# Patient Record
Sex: Female | Born: 1985 | Race: Black or African American | Hispanic: No | Marital: Married | Smoking: Current every day smoker
Health system: Southern US, Community
[De-identification: ages and names within clinical notes are randomized; demographics above are authoritative.]

## PROBLEM LIST (undated history)

## (undated) ENCOUNTER — Inpatient Hospital Stay (HOSPITAL_COMMUNITY): Payer: Medicaid Other

## (undated) DIAGNOSIS — R87629 Unspecified abnormal cytological findings in specimens from vagina: Secondary | ICD-10-CM

## (undated) HISTORY — PX: DILATION AND CURETTAGE OF UTERUS: SHX78

## (undated) HISTORY — PX: OVARIAN CYST REMOVAL: SHX89

---
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
## 2008-03-27 ENCOUNTER — Emergency Department: Payer: Self-pay | Admitting: Emergency Medicine

## 2010-01-25 ENCOUNTER — Emergency Department: Payer: Self-pay | Admitting: Emergency Medicine

## 2010-03-10 ENCOUNTER — Observation Stay: Payer: Self-pay | Admitting: Obstetrics and Gynecology

## 2010-04-27 ENCOUNTER — Encounter: Payer: Self-pay | Admitting: Obstetrics and Gynecology

## 2010-06-09 ENCOUNTER — Encounter: Payer: Self-pay | Admitting: Pediatric Cardiology

## 2010-07-01 ENCOUNTER — Emergency Department: Payer: Self-pay | Admitting: Emergency Medicine

## 2010-07-29 ENCOUNTER — Observation Stay: Payer: Self-pay

## 2013-02-21 ENCOUNTER — Encounter (HOSPITAL_COMMUNITY): Payer: Self-pay | Admitting: Emergency Medicine

## 2013-02-21 ENCOUNTER — Emergency Department (HOSPITAL_COMMUNITY)
Admission: EM | Admit: 2013-02-21 | Discharge: 2013-02-21 | Disposition: A | Discharge status: Home / Self Care | Payer: Self-pay | Attending: Emergency Medicine | Admitting: Emergency Medicine

## 2013-02-21 DIAGNOSIS — G5600 Carpal tunnel syndrome, unspecified upper limb: Secondary | ICD-10-CM | POA: Insufficient documentation

## 2013-02-21 DIAGNOSIS — F17200 Nicotine dependence, unspecified, uncomplicated: Secondary | ICD-10-CM | POA: Insufficient documentation

## 2013-02-21 DIAGNOSIS — G5602 Carpal tunnel syndrome, left upper limb: Secondary | ICD-10-CM

## 2013-02-21 DIAGNOSIS — R209 Unspecified disturbances of skin sensation: Secondary | ICD-10-CM | POA: Insufficient documentation

## 2013-02-21 MED ORDER — NAPROXEN 500 MG PO TABS: 500.0000 mg | ORAL_TABLET | Freq: Two times a day (BID) | ORAL | Status: DC

## 2013-02-21 NOTE — ED Notes (Signed)
Pt states that she has pain in her right wrist and numbness in her left hand that has been going on for about three months.  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

## 2013-02-21 NOTE — ED Provider Notes (Signed)
Medical screening examination/treatment/procedure(s) were performed by non-physician practitioner and as supervising physician I was immediately available for consultation/collaboration.    Gilda Crease, MD 02/21/13 [phone]
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
## 2013-02-21 NOTE — ED Provider Notes (Signed)
History    This chart was scribed for non-physician practitioner Magnus Sinning, PA-C, working with Gilda Crease,  by Donne Anon, ED Scribe. This patient was seen in room WTR7/WTR7 and the patient's care was started at 1504.  CSN: [id number] Arrival date & time 02/21/13  1354  First MD Initiated Contact with Patient 02/21/13 1504     Chief Complaint  Patient presents with  . Wrist Pain    right hand  . Numbness    let hand    The history is provided by the patient. No language interpreter was used.   HPI Comments: Cathy [name] is a 27 y.o. female who presents to the Emergency Department complaining of 3 months of gradual onset, gradually worsening, waxing and waning, moderate bilateral hand and wrist pain that radiates up her forearm and is worse in the left hand. She reports associated subjective numbness in the first 4 digits of the left hand and the second digit of the right hand. The pain is worse at night and in the morning when she wakes up. 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    History reviewed. No pertinent past medical history. Past Surgical History  Procedure Laterality Date  . Cesarean section     No family history on file. History  Substance Use Topics  . Smoking status: Current Every Day Smoker    Types: Cigarettes  . Smokeless tobacco: Not on file  . Alcohol Use: No   OB History   Grav Para Term Preterm Abortions TAB SAB Ect Mult Living                 Review of Systems  Musculoskeletal: Positive for arthralgias.  Neurological: Positive for numbness.  All other systems reviewed and are negative.    Allergies  Review of patient's allergies indicates not on file.  Home Medications  No current outpatient prescriptions on file.  Triage Vitals; BP 120/60  Pulse 68  Temp(Src) 99.4 F (37.4 C) (Oral)  Resp 16   SpO2 100%  LMP 02/06/2013  Physical Exam  Nursing note and vitals reviewed. Constitutional: She appears well-developed and well-nourished. No distress.  HENT:  Head: Normocephalic and atraumatic.  Eyes: Conjunctivae are normal.  Neck: Neck supple. No tracheal deviation present.  Cardiovascular: Normal rate, regular rhythm, normal heart sounds and intact distal pulses.  Exam reveals no gallop and no friction rub.   No murmur heard. Pulses:      Radial pulses are 2+ on the right side, and 2+ on the left side.  Pulmonary/Chest: Effort normal and breath sounds normal. No respiratory distress. She has no wheezes. She has no rales.  Musculoskeletal: Normal range of motion.       Right wrist: She exhibits normal range of motion, no tenderness, no bony tenderness and no swelling.       Left wrist: She exhibits normal range of motion, no tenderness, no bony tenderness and no swelling.  Distal sensation of both hands intact. Positive Phalen's sign bilaterally. Negative Tinel's sign bilaterally.    Neurological: She is alert.  Grip strength 5/5 bilaterally  Skin: Skin is warm and dry.  Psychiatric: She has a normal mood and affect. Her behavior is normal.    ED Course  Procedures (including critical care time) DIAGNOSTIC STUDIES: Oxygen Saturation is 100% on RA, normal by my interpretation.  COORDINATION OF CARE: 3:16 PM Discussed treatment plan which includes Naproxen and wrist splint with pt at bedside and pt agreed to plan. Discussed carpal tunnel. Orthopedic referral given.    Labs Reviewed - No data to display No results found. No diagnosis found.  MDM  Signs and symptoms most consistent with Carpal Tunnel.  Patient given wrist splint and instructed to take NSAIDs.  Patient also instructed to follow up with PCP if symptoms continue and also given referral to Hand Surgery.  I personally performed the services described in this documentation, which was scribed in my presence. The  recorded information has been reviewed and is accurate.    Pascal Lux [place], PA-C 02/21/13 1539

## 2013-12-09 ENCOUNTER — Emergency Department: Payer: Self-pay | Admitting: Emergency Medicine

## 2013-12-17 ENCOUNTER — Emergency Department: Payer: Self-pay | Admitting: Emergency Medicine

## 2013-12-17 LAB — COMPREHENSIVE METABOLIC PANEL
AST: 11 U/L — AB (ref 15–37)
Albumin: 3.5 g/dL (ref 3.4–5.0)
Alkaline Phosphatase: 38 U/L — ABNORMAL LOW
Anion Gap: 4 — ABNORMAL LOW (ref 7–16)
BUN: 8 mg/dL (ref 7–18)
Bilirubin,Total: 0.3 mg/dL (ref 0.2–1.0)
CALCIUM: 8.7 mg/dL (ref 8.5–10.1)
CO2: 27 mmol/L (ref 21–32)
Chloride: 104 mmol/L (ref 98–107)
Creatinine: 0.58 mg/dL — ABNORMAL LOW (ref 0.60–1.30)
EGFR (Non-African Amer.): >60
GLUCOSE: 90 mg/dL (ref 65–99)
Osmolality: 268 (ref 275–301)
POTASSIUM: 3.8 mmol/L (ref 3.5–5.1)
SGPT (ALT): 18 U/L (ref 12–78)
Sodium: 135 mmol/L — ABNORMAL LOW (ref 136–145)
Total Protein: 7.3 g/dL (ref 6.4–8.2)

## 2013-12-17 LAB — CBC WITH DIFFERENTIAL/PLATELET
BASOS ABS: 0 10*3/uL (ref 0.0–0.1)
Basophil %: 0.5 %
EOS PCT: 1.6 %
Eosinophil #: 0.1 10*3/uL (ref 0.0–0.7)
HCT: 36.4 % (ref 35.0–47.0)
HGB: 12.1 g/dL (ref 12.0–16.0)
LYMPHS ABS: 2.7 10*3/uL (ref 1.0–3.6)
Lymphocyte %: 29.1 %
MCH: 31.4 pg (ref 26.0–34.0)
MCHC: 33.3 g/dL (ref 32.0–36.0)
MCV: 94 fL (ref 80–100)
Monocyte #: 0.8 x10 3/mm (ref 0.2–0.9)
Monocyte %: 8.6 %
NEUTROS PCT: 60.2 %
Neutrophil #: 5.6 10*3/uL (ref 1.4–6.5)
Platelet: 288 10*3/uL (ref 150–440)
RBC: 3.85 10*6/uL (ref 3.80–5.20)
RDW: 12.8 % (ref 11.5–14.5)
WBC: 9.3 10*3/uL (ref 3.6–11.0)

## 2013-12-17 LAB — URINALYSIS, COMPLETE
BILIRUBIN, UR: NEGATIVE
BLOOD: NEGATIVE
Glucose,UR: NEGATIVE mg/dL (ref 0–75)
Ketone: NEGATIVE
Leukocyte Esterase: NEGATIVE
NITRITE: NEGATIVE
PH: 7 (ref 4.5–8.0)
Protein: NEGATIVE
RBC,UR: NONE SEEN /HPF (ref 0–5)
SPECIFIC GRAVITY: 1.023 (ref 1.003–1.030)
Squamous Epithelial: <1
WBC UR: NONE SEEN /HPF (ref 0–5)

## 2013-12-17 LAB — TSH: Thyroid Stimulating Horm: 1.65 u[IU]/mL

## 2013-12-17 LAB — HCG, QUANTITATIVE, PREGNANCY: Beta Hcg, Quant.: 66067 m[IU]/mL — ABNORMAL HIGH

## 2014-03-04 ENCOUNTER — Emergency Department: Payer: Self-pay | Admitting: Emergency Medicine

## 2014-03-19 ENCOUNTER — Observation Stay: Payer: Self-pay | Admitting: Obstetrics and Gynecology

## 2014-03-19 ENCOUNTER — Emergency Department: Payer: Self-pay | Admitting: Emergency Medicine

## 2014-04-25 ENCOUNTER — Encounter: Payer: Self-pay | Admitting: Maternal & Fetal Medicine

## 2014-05-02 ENCOUNTER — Encounter: Payer: Self-pay | Admitting: Maternal & Fetal Medicine

## 2014-05-24 ENCOUNTER — Observation Stay: Payer: Self-pay | Admitting: Obstetrics and Gynecology

## 2014-06-24 ENCOUNTER — Encounter (HOSPITAL_COMMUNITY): Payer: Self-pay | Admitting: *Deleted

## 2014-06-24 ENCOUNTER — Inpatient Hospital Stay (HOSPITAL_COMMUNITY)
Admission: AD | Admit: 2014-06-24 | Discharge: 2014-06-24 | Disposition: A | Discharge status: Home / Self Care | Payer: Medicaid Other | Source: Ambulatory Visit | Attending: Family Medicine | Admitting: Family Medicine

## 2014-06-24 DIAGNOSIS — N949 Unspecified condition associated with female genital organs and menstrual cycle: Secondary | ICD-10-CM

## 2014-06-24 DIAGNOSIS — Z720 Tobacco use: Secondary | ICD-10-CM | POA: Diagnosis not present

## 2014-06-24 DIAGNOSIS — Z3A34 34 weeks gestation of pregnancy: Secondary | ICD-10-CM | POA: Diagnosis not present

## 2014-06-24 DIAGNOSIS — R102 Pelvic and perineal pain: Secondary | ICD-10-CM | POA: Diagnosis not present

## 2014-06-24 DIAGNOSIS — O26893 Other specified pregnancy related conditions, third trimester: Secondary | ICD-10-CM | POA: Diagnosis not present

## 2014-06-24 HISTORY — DX: Unspecified abnormal cytological findings in specimens from vagina: R87.629

## 2014-06-24 LAB — URINALYSIS, ROUTINE W REFLEX MICROSCOPIC
Bilirubin Urine: NEGATIVE
Glucose, UA: NEGATIVE mg/dL
Hgb urine dipstick: NEGATIVE
Ketones, ur: NEGATIVE mg/dL
Leukocytes, UA: NEGATIVE
Nitrite: NEGATIVE
Protein, ur: NEGATIVE mg/dL
Specific Gravity, Urine: >1.03 — ABNORMAL HIGH (ref 1.005–1.030)
Urobilinogen, UA: 0.2 mg/dL (ref 0.0–1.0)
pH: 7 (ref 5.0–8.0)

## 2014-06-24 LAB — WET PREP, GENITAL
Trich, Wet Prep: NONE SEEN
WBC, Wet Prep HPF POC: NONE SEEN
Yeast Wet Prep HPF POC: NONE SEEN

## 2014-06-24 LAB — POCT FERN TEST: POCT Fern Test: NEGATIVE

## 2014-06-24 MED ORDER — METRONIDAZOLE 500 MG PO TABS: 500.0000 mg | ORAL_TABLET | Freq: Two times a day (BID) | ORAL | Status: DC

## 2014-06-24 NOTE — Discharge Instructions (Signed)

## 2014-06-24 NOTE — MAU Provider Note (Signed)
Chief Complaint:  Pelvic Pressure   None     HPI: Cathy [name] is a 28 y.o.  [id number] @ 7857w3d w/ PMH only contributory for c-section x 4. [place] [facility] [place] [facility], but [place]. [facility].  Notes for the past 3-4 weeks she has noted some worsening pelvic pressure and pain. She also notes some worsening leakage of fluid over the past several weeks (~ 3 wks ago) that her boyfriend noticed that he was afraid she "wet her underwear with." Since, she has had to wear a thin panty liner for fluid. Denies any large gush of fluid.  Denies contractions or vaginal bleeding. Good fetal movement.   Pregnancy Course:  Reportedly uncomplicated [facility]   Past Medical History: Past Medical History  Diagnosis Date  . Vaginal Pap smear, abnormal     Past obstetric history: OB History  Gravida Para Term Preterm AB SAB TAB Ectopic Multiple Living  5 4 2 2     1 5     # Outcome Date GA Lbr Len/2nd Weight Sex Delivery Anes PTL Lv  5 Current           4 Preterm           3 Preterm           2 Term           1 Term               Past Surgical History: Past Surgical History  Procedure Laterality Date  . Cesarean section    . Ovarian cyst removal    . Dilation and curettage of uterus       Family History: History reviewed. No pertinent family history.  Social History: History  Substance Use Topics  . Smoking status: Current Every Day Smoker -- 0.25 packs/day    Types: Cigarettes  . Smokeless tobacco: Not on file  . Alcohol Use: No    Allergies:  Allergies  Allergen Reactions  . Penicillins Other (See Comments)    Unknown childhood reaction.  . Latex Itching, Swelling and Rash    Meds:  No prescriptions prior to admission    ROS: Pertinent findings in history of present illness.  Physical Exam  Blood pressure 115/62, pulse 77, temperature 98.4 F (36.9 C), temperature  source Oral, resp. rate 18, height 5\' 1"  (1.549 m), weight 224 lb (101.606 kg). GENERAL: Well-developed, well-nourished female in no acute distress.  HEENT: normocephalic HEART: normal rate RESP: normal effort ABDOMEN: Soft, non-tender, gravid appropriate for gestational age EXTREMITIES: Nontender, no edema NEURO: alert and oriented SPECULUM EXAM: NEFG, copious white discharge, no blood, cervix clean Dilation: Closed Effacement (%): Thick Cervical Position: Posterior Exam by:: Dr. Su Hiltoberts  FHT:  Baseline 130 , moderate variability, accelerations present, no decelerations Contractions: quiet   Labs: Results for orders placed or performed during the hospital encounter of 06/24/14 (from the past 24 hour(s))  Urinalysis, Routine w reflex microscopic     Status: Abnormal   Collection Time: 06/24/14  3:55 PM  Result Value Ref Range   Color, Urine YELLOW YELLOW   APPearance CLEAR CLEAR   Specific Gravity, Urine >1.030 (H) 1.005 - 1.030   pH 7.0 5.0 - 8.0   Glucose, UA NEGATIVE NEGATIVE mg/dL   Hgb urine dipstick NEGATIVE NEGATIVE   Bilirubin Urine NEGATIVE NEGATIVE   Ketones, ur NEGATIVE NEGATIVE mg/dL   Protein,  ur NEGATIVE NEGATIVE mg/dL   Urobilinogen, UA 0.2 0.0 - 1.0 mg/dL   Nitrite NEGATIVE NEGATIVE   Leukocytes, UA NEGATIVE NEGATIVE  Wet prep, genital     Status: Abnormal   Collection Time: 06/24/14  5:10 PM  Result Value Ref Range   Yeast Wet Prep HPF POC NONE SEEN NONE SEEN   Trich, Wet Prep NONE SEEN NONE SEEN   Clue Cells Wet Prep HPF POC MODERATE (A) NONE SEEN   WBC, Wet Prep HPF POC NONE SEEN NONE SEEN  Fern Test     Status: None   Collection Time: 06/24/14  5:40 PM  Result Value Ref Range   POCT Fern Test Negative = intact amniotic membranes     Imaging:  No results found.  MAU Course: SVE with cervix c/t/h SSE with cervix visually closed. No pooling. No ferning on slide Wet prep with mod clue cells  Assessment: 1. Pelvic pressure in pregnancy,  antepartum, third trimester     Plan: Discharge home Flagyl 500 mg BID x 7d, likely BV source of increased discharged Suspecy pelvic pain 2/2 normal pregnancy pains vs streching as pregnancy progresses. Not in active labor. [facility] Pre-term Labor precautions and fetal kick counts     Follow-up Information    [facility]. Schedule an appointment as soon as possible for a visit in 1 week.   Contact information:   [address] [place] [phone]       Medication List    STOP taking these medications        naproxen 500 MG tablet  Commonly known as:  NAPROSYN      TAKE these medications        calcium carbonate 500 MG chewable tablet  Commonly known as:  TUMS - dosed in mg elemental calcium  Chew 1 tablet by mouth 2 (two) times daily as needed for indigestion or heartburn.     HAIR SKIN & NAILS GUMMIES PO  Take 2 each by mouth daily.     metroNIDAZOLE 500 MG tablet  Commonly known as:  FLAGYL  Take 1 tablet (500 mg total) by mouth 2 (two) times daily.        Ethelda Chickaroline Nayquan Evinger, MD 06/25/2014 3:35 AM

## 2014-06-24 NOTE — MAU Note (Signed)
Pt has had a hx of 4 c/s and states she feels like pressure in her vagina and also old incisional pain for several days.  Denies vaginal bleeding.  Pt states she is having some leaking of fluids daily and requiring to wear a thin pad.   Good fetal movement.  Pt is not a very good historian and has difficulty explaining what she is feeling.

## 2014-06-26 ENCOUNTER — Ambulatory Visit (INDEPENDENT_AMBULATORY_CARE_PROVIDER_SITE_OTHER): Payer: Medicaid Other | Admitting: Family Medicine

## 2014-06-26 ENCOUNTER — Encounter: Payer: Self-pay | Admitting: Family Medicine

## 2014-06-26 VITALS — BP 106/53 | HR 93 | Temp 98.4°F | Wt 234.0 lb

## 2014-06-26 DIAGNOSIS — Z3483 Encounter for supervision of other normal pregnancy, third trimester: Secondary | ICD-10-CM

## 2014-06-26 DIAGNOSIS — O3421 Maternal care for scar from previous cesarean delivery: Secondary | ICD-10-CM

## 2014-06-26 DIAGNOSIS — Z8279 Family history of other congenital malformations, deformations and chromosomal abnormalities: Secondary | ICD-10-CM

## 2014-06-26 DIAGNOSIS — O34219 Maternal care for unspecified type scar from previous cesarean delivery: Secondary | ICD-10-CM

## 2014-06-26 DIAGNOSIS — Z3480 Encounter for supervision of other normal pregnancy, unspecified trimester: Secondary | ICD-10-CM | POA: Insufficient documentation

## 2014-06-26 LAB — POCT URINALYSIS DIP (DEVICE)
Bilirubin Urine: NEGATIVE
Glucose, UA: NEGATIVE mg/dL
Hgb urine dipstick: NEGATIVE
Ketones, ur: NEGATIVE mg/dL
Leukocytes, UA: NEGATIVE
Nitrite: NEGATIVE
Protein, ur: NEGATIVE mg/dL
Specific Gravity, Urine: 1.02 (ref 1.005–1.030)
Urobilinogen, UA: 1 mg/dL (ref 0.0–1.0)
pH: 7 (ref 5.0–8.0)

## 2014-06-26 NOTE — Patient Instructions (Signed)
Third Trimester of Pregnancy The third trimester is from week 29 through week 42, months 7 through 9. The third trimester is a time when the fetus is growing rapidly. At the end of the ninth month, the fetus is about 20 inches in length and weighs 6-10 pounds.  BODY CHANGES Your body goes through many changes during pregnancy. The changes vary from woman to woman.   Your weight will continue to increase. You can expect to gain 25-35 pounds (11-16 kg) by the end of the pregnancy.  You may begin to get stretch marks on your hips, abdomen, and breasts.  You may urinate more often because the fetus is moving lower into your pelvis and pressing on your bladder.  You may develop or continue to have heartburn as a result of your pregnancy.  You may develop constipation because certain hormones are causing the muscles that push waste through your intestines to slow down.  You may develop hemorrhoids or swollen, bulging veins (varicose veins).  You may have pelvic pain because of the weight gain and pregnancy hormones relaxing your joints between the bones in your pelvis. Backaches may result from overexertion of the muscles supporting your posture.  You may have changes in your hair. These can include thickening of your hair, rapid growth, and changes in texture. Some women also have hair loss during or after pregnancy, or hair that feels dry or thin. Your hair will most likely return to normal after your baby is born.  Your breasts will continue to grow and be tender. A yellow discharge may leak from your breasts called colostrum.  Your belly button may stick out.  You may feel short of breath because of your expanding uterus.  You may notice the fetus "dropping," or moving lower in your abdomen.  You may have a bloody mucus discharge. This usually occurs a few days to a week before labor begins.  Your cervix becomes thin and soft (effaced) near your due date. WHAT TO EXPECT AT YOUR PRENATAL  EXAMS  You will have prenatal exams every 2 weeks until week 36. Then, you will have weekly prenatal exams. During a routine prenatal visit:  You will be weighed to make sure you and the fetus are growing normally.  Your blood pressure is taken.  Your abdomen will be measured to track your baby's growth.  The fetal heartbeat will be listened to.  Any test results from the previous visit will be discussed.  You may have a cervical check near your due date to see if you have effaced. At around 36 weeks, your caregiver will check your cervix. At the same time, your caregiver will also perform a test on the secretions of the vaginal tissue. This test is to determine if a type of bacteria, Group B streptococcus, is present. Your caregiver will explain this further. Your caregiver may ask you:  What your birth plan is.  How you are feeling.  If you are feeling the baby move.  If you have had any abnormal symptoms, such as leaking fluid, bleeding, severe headaches, or abdominal cramping.  If you have any questions. Other tests or screenings that may be performed during your third trimester include:  Blood tests that check for low iron levels (anemia).  Fetal testing to check the health, activity level, and growth of the fetus. Testing is done if you have certain medical conditions or if there are problems during the pregnancy. FALSE LABOR You may feel small, irregular contractions that   eventually go away. These are called Braxton Hicks contractions, or false labor. Contractions may last for hours, days, or even weeks before true labor sets in. If contractions come at regular intervals, intensify, or become painful, it is best to be seen by your caregiver.  SIGNS OF LABOR   Menstrual-like cramps.  Contractions that are 5 minutes apart or less.  Contractions that start on the top of the uterus and spread down to the lower abdomen and back.  A sense of increased pelvic pressure or back  pain.  A watery or bloody mucus discharge that comes from the vagina. If you have any of these signs before the 37th week of pregnancy, call your caregiver right away. You need to go to the hospital to get checked immediately. HOME CARE INSTRUCTIONS   Avoid all smoking, herbs, alcohol, and unprescribed drugs. These chemicals affect the formation and growth of the baby.  Follow your caregiver's instructions regarding medicine use. There are medicines that are either safe or unsafe to take during pregnancy.  Exercise only as directed by your caregiver. Experiencing uterine cramps is a good sign to stop exercising.  Continue to eat regular, healthy meals.  Wear a good support bra for breast tenderness.  Do not use hot tubs, steam rooms, or saunas.  Wear your seat belt at all times when driving.  Avoid raw meat, uncooked cheese, cat litter boxes, and soil used by cats. These carry germs that can cause birth defects in the baby.  Take your prenatal vitamins.  Try taking a stool softener (if your caregiver approves) if you develop constipation. Eat more high-fiber foods, such as fresh vegetables or fruit and whole grains. Drink plenty of fluids to keep your urine clear or pale yellow.  Take warm sitz baths to soothe any pain or discomfort caused by hemorrhoids. Use hemorrhoid cream if your caregiver approves.  If you develop varicose veins, wear support hose. Elevate your feet for 15 minutes, 3-4 times a day. Limit salt in your diet.  Avoid heavy lifting, wear low heal shoes, and practice good posture.  Rest a lot with your legs elevated if you have leg cramps or low back pain.  Visit your dentist if you have not gone during your pregnancy. Use a soft toothbrush to brush your teeth and be gentle when you floss.  A sexual relationship may be continued unless your caregiver directs you otherwise.  Do not travel far distances unless it is absolutely necessary and only with the approval  of your caregiver.  Take prenatal classes to understand, practice, and ask questions about the labor and delivery.  Make a trial run to the hospital.  Pack your hospital bag.  Prepare the baby's nursery.  Continue to go to all your prenatal visits as directed by your caregiver. SEEK MEDICAL CARE IF:  You are unsure if you are in labor or if your water has broken.  You have dizziness.  You have mild pelvic cramps, pelvic pressure, or nagging pain in your abdominal area.  You have persistent nausea, vomiting, or diarrhea.  You have a bad smelling vaginal discharge.  You have pain with urination. SEEK IMMEDIATE MEDICAL CARE IF:   You have a fever.  You are leaking fluid from your vagina.  You have spotting or bleeding from your vagina.  You have severe abdominal cramping or pain.  You have rapid weight loss or gain.  You have shortness of breath with chest pain.  You notice sudden or extreme swelling   of your face, hands, ankles, feet, or legs.  You have not felt your baby move in over an hour.  You have severe headaches that do not go away with medicine.  You have vision changes. Document Released: 07/13/2001 Document Revised: 07/24/2013 Document Reviewed: 09/19/2012 ExitCare Patient Information 2015 [employer]. This information is not intended to replace advice given to you by your health care provider. Make sure you discuss any questions you have with your health care provider.  

## 2014-06-26 NOTE — Progress Notes (Signed)
Has prior baby with down syndrome - gave baby up for adoption.  Did have [facility] at [facility].  No complaints today - some pelvic pressure.  Will get 28 week labs.  [place].  Will get ROI.  Schedule for repeat cesarean at 39 weeks.

## 2014-06-26 NOTE — Progress Notes (Signed)
Patient reports being seen [facility] at [facility] in [place]-- started care after 20 weeks. Reports she was also being seen at [facility] for testing as her first child had downs syndrome-- according to patient everything with this pregnancy looks normal. Reports having "all kinds of blood work" done [facility] but states they never did her 28 week labs or gestational diabetes screening.  C/o of pelvic pressure.  28 week labs and 1hr gtt today.  Patient to sign ROI to request prenatal [facility].

## 2014-06-27 LAB — CBC
HCT: 32.3 % — ABNORMAL LOW (ref 36.0–46.0)
Hemoglobin: 10.7 g/dL — ABNORMAL LOW (ref 12.0–15.0)
MCH: 29.9 pg (ref 26.0–34.0)
MCHC: 33.1 g/dL (ref 30.0–36.0)
MCV: 90.2 fL (ref 78.0–100.0)
MPV: 9.8 fL (ref 9.4–12.4)
Platelets: 247 10*3/uL (ref 150–400)
RBC: 3.58 MIL/uL — ABNORMAL LOW (ref 3.87–5.11)
RDW: 13.7 % (ref 11.5–15.5)
WBC: 10.9 10*3/uL — ABNORMAL HIGH (ref 4.0–10.5)

## 2014-06-27 LAB — RPR

## 2014-06-27 LAB — HIV ANTIBODY (ROUTINE TESTING W REFLEX): HIV 1&2 Ab, 4th Generation: NONREACTIVE

## 2014-06-27 LAB — GLUCOSE TOLERANCE, 1 HOUR (50G) W/O FASTING: Glucose, 1 Hour GTT: 92 mg/dL (ref 70–140)

## 2014-07-01 ENCOUNTER — Encounter: Payer: Self-pay | Admitting: *Deleted

## 2014-07-02 LAB — GC/CHLAMYDIA PROBE AMP
CT Probe RNA: NEGATIVE
GC Probe RNA: NEGATIVE

## 2014-07-03 ENCOUNTER — Encounter: Payer: Self-pay | Admitting: *Deleted

## 2014-07-08 ENCOUNTER — Encounter: Payer: Medicaid Other | Admitting: Obstetrics & Gynecology

## 2014-07-10 ENCOUNTER — Encounter: Payer: Self-pay | Admitting: Advanced Practice Midwife

## 2014-07-10 ENCOUNTER — Ambulatory Visit (INDEPENDENT_AMBULATORY_CARE_PROVIDER_SITE_OTHER): Payer: Medicaid Other | Admitting: Advanced Practice Midwife

## 2014-07-10 VITALS — BP 99/63 | HR 90 | Temp 97.8°F | Wt 237.8 lb

## 2014-07-10 DIAGNOSIS — Z98891 History of uterine scar from previous surgery: Secondary | ICD-10-CM

## 2014-07-10 DIAGNOSIS — O3421 Maternal care for scar from previous cesarean delivery: Secondary | ICD-10-CM

## 2014-07-10 LAB — POCT URINALYSIS DIP (DEVICE)
Bilirubin Urine: NEGATIVE
Glucose, UA: NEGATIVE mg/dL
Hgb urine dipstick: NEGATIVE
Ketones, ur: NEGATIVE mg/dL
Leukocytes, UA: NEGATIVE
Nitrite: NEGATIVE
PH: 6.5 (ref 5.0–8.0)
Protein, ur: NEGATIVE mg/dL
Specific Gravity, Urine: 1.02 (ref 1.005–1.030)
UROBILINOGEN UA: 1 mg/dL (ref 0.0–1.0)

## 2014-07-10 LAB — OB RESULTS CONSOLE GC/CHLAMYDIA
Chlamydia: NEGATIVE
Gonorrhea: NEGATIVE

## 2014-07-10 NOTE — Progress Notes (Signed)
C Section scheduled for July 30, 2014.

## 2014-07-10 NOTE — Patient Instructions (Addendum)
Third Trimester of Pregnancy The third trimester is from week 29 through week 42, months 7 through 9. The third trimester is a time when the fetus is growing rapidly. At the end of the ninth month, the fetus is about 20 inches in length and weighs 6-10 pounds.  BODY CHANGES Your body goes through many changes during pregnancy. The changes vary from woman to woman.   Your weight will continue to increase. You can expect to gain 25-35 pounds (11-16 kg) by the end of the pregnancy.  You may begin to get stretch marks on your hips, abdomen, and breasts.  You may urinate more often because the fetus is moving lower into your pelvis and pressing on your bladder.  You may develop or continue to have heartburn as a result of your pregnancy.  You may develop constipation because certain hormones are causing the muscles that push waste through your intestines to slow down.  You may develop hemorrhoids or swollen, bulging veins (varicose veins).  You may have pelvic pain because of the weight gain and pregnancy hormones relaxing your joints between the bones in your pelvis. Backaches may result from overexertion of the muscles supporting your posture.  You may have changes in your hair. These can include thickening of your hair, rapid growth, and changes in texture. Some women also have hair loss during or after pregnancy, or hair that feels dry or thin. Your hair will most likely return to normal after your baby is born.  Your breasts will continue to grow and be tender. A yellow discharge may leak from your breasts called colostrum.  Your belly button may stick out.  You may feel short of breath because of your expanding uterus.  You may notice the fetus "dropping," or moving lower in your abdomen.  You may have a bloody mucus discharge. This usually occurs a few days to a week before labor begins.  Your cervix becomes thin and soft (effaced) near your due date. WHAT TO EXPECT AT YOUR PRENATAL  EXAMS  You will have prenatal exams every 2 weeks until week 36. Then, you will have weekly prenatal exams. During a routine prenatal visit:  You will be weighed to make sure you and the fetus are growing normally.  Your blood pressure is taken.  Your abdomen will be measured to track your baby's growth.  The fetal heartbeat will be listened to.  Any test results from the previous visit will be discussed.  You may have a cervical check near your due date to see if you have effaced. At around 36 weeks, your caregiver will check your cervix. At the same time, your caregiver will also perform a test on the secretions of the vaginal tissue. This test is to determine if a type of bacteria, Group B streptococcus, is present. Your caregiver will explain this further. Your caregiver may ask you:  What your birth plan is.  How you are feeling.  If you are feeling the baby move.  If you have had any abnormal symptoms, such as leaking fluid, bleeding, severe headaches, or abdominal cramping.  If you have any questions. Other tests or screenings that may be performed during your third trimester include:  Blood tests that check for low iron levels (anemia).  Fetal testing to check the health, activity level, and growth of the fetus. Testing is done if you have certain medical conditions or if there are problems during the pregnancy. FALSE LABOR You may feel small, irregular contractions that   eventually go away. These are called Braxton Hicks contractions, or false labor. Contractions may last for hours, days, or even weeks before true labor sets in. If contractions come at regular intervals, intensify, or become painful, it is best to be seen by your caregiver.  SIGNS OF LABOR   Menstrual-like cramps.  Contractions that are 5 minutes apart or less.  Contractions that start on the top of the uterus and spread down to the lower abdomen and back.  A sense of increased pelvic pressure or back  pain.  A watery or bloody mucus discharge that comes from the vagina. If you have any of these signs before the 37th week of pregnancy, call your caregiver right away. You need to go to the hospital to get checked immediately. HOME CARE INSTRUCTIONS   Avoid all smoking, herbs, alcohol, and unprescribed drugs. These chemicals affect the formation and growth of the baby.  Follow your caregiver's instructions regarding medicine use. There are medicines that are either safe or unsafe to take during pregnancy.  Exercise only as directed by your caregiver. Experiencing uterine cramps is a good sign to stop exercising.  Continue to eat regular, healthy meals.  Wear a good support bra for breast tenderness.  Do not use hot tubs, steam rooms, or saunas.  Wear your seat belt at all times when driving.  Avoid raw meat, uncooked cheese, cat litter boxes, and soil used by cats. These carry germs that can cause birth defects in the baby.  Take your prenatal vitamins.  Try taking a stool softener (if your caregiver approves) if you develop constipation. Eat more high-fiber foods, such as fresh vegetables or fruit and whole grains. Drink plenty of fluids to keep your urine clear or pale yellow.  Take warm sitz baths to soothe any pain or discomfort caused by hemorrhoids. Use hemorrhoid cream if your caregiver approves.  If you develop varicose veins, wear support hose. Elevate your feet for 15 minutes, 3-4 times a day. Limit salt in your diet.  Avoid heavy lifting, wear low heal shoes, and practice good posture.  Rest a lot with your legs elevated if you have leg cramps or low back pain.  Visit your dentist if you have not gone during your pregnancy. Use a soft toothbrush to brush your teeth and be gentle when you floss.  A sexual relationship may be continued unless your caregiver directs you otherwise.  Do not travel far distances unless it is absolutely necessary and only with the approval  of your caregiver.  Take prenatal classes to understand, practice, and ask questions about the labor and delivery.  Make a trial run to the hospital.  Pack your hospital bag.  Prepare the baby's nursery.  Continue to go to all your prenatal visits as directed by your caregiver. SEEK MEDICAL CARE IF:  You are unsure if you are in labor or if your water has broken.  You have dizziness.  You have mild pelvic cramps, pelvic pressure, or nagging pain in your abdominal area.  You have persistent nausea, vomiting, or diarrhea.  You have a bad smelling vaginal discharge.  You have pain with urination. SEEK IMMEDIATE MEDICAL CARE IF:   You have a fever.  You are leaking fluid from your vagina.  You have spotting or bleeding from your vagina.  You have severe abdominal cramping or pain.  You have rapid weight loss or gain.  You have shortness of breath with chest pain.  You notice sudden or extreme swelling   of your face, hands, ankles, feet, or legs.  You have not felt your baby move in over an hour.  You have severe headaches that do not go away with medicine.  You have vision changes. Document Released: 07/13/2001 Document Revised: 07/24/2013 Document Reviewed: 09/19/2012 Exit[employer] Patient Information 2015 [employer]. This information is not intended to replace advice given to you by your health care provider. Make sure you discuss any questions you have with your health care provider.  

## 2014-07-10 NOTE — Progress Notes (Signed)
Patient upset because she believes she is supposed to be high risk.  Reports noticing two days last week where baby moved only a few times during the day(decrease in movement)-- reports baby is now more active.  Reports pelvic pressure.

## 2014-07-10 NOTE — Progress Notes (Signed)
Patient has concerns that she is being seen by a CNM in Low Risk clinic "when I am clearly High Risk".  She is concerned that her C/S is scheduled on 29th, worried she will "bust open and bleed to death at any minute" before then. Has concerns that her appointment was 2 weeks after her last instead of 1 week (states Dr Adrian BlackwaterStinson told her 1 week) and that she had an appt Monday and it was moved to today.   Discussed most of risk of rupture occurs in labor, not before. Discussed reasons for C/S scheduled after 39 wks. Patient is not satisfied and is very upset. States she has pain and pressure in her lower abdomen and groin whenever she walks, and feels this is a sign that "everything is going to blow apart and we will both die".    Dr Debroah LoopArnold came down and spoke with patient. He recommends she go to MFM for an official second opinion on C/S timing. Sallyanne HaversKelley Rassette in to discuss scheduling options with patient. GC/Chl done on urine. GBS not done today due to agitation of patient. She did allow me to listen to FHR, but demands all her visits be done by an Geologist, engineeringbstetrician.

## 2014-07-11 LAB — GC/CHLAMYDIA PROBE AMP
CT Probe RNA: NEGATIVE
GC Probe RNA: NEGATIVE

## 2014-07-12 ENCOUNTER — Ambulatory Visit (HOSPITAL_COMMUNITY)
Admission: RE | Admit: 2014-07-12 | Discharge: 2014-07-12 | Disposition: A | Discharge status: Home / Self Care | Payer: Medicaid Other | Source: Ambulatory Visit | Attending: Advanced Practice Midwife | Admitting: Advanced Practice Midwife

## 2014-07-12 VITALS — BP 136/61 | Wt 239.8 lb

## 2014-07-12 DIAGNOSIS — Z3A36 36 weeks gestation of pregnancy: Secondary | ICD-10-CM | POA: Diagnosis not present

## 2014-07-12 DIAGNOSIS — O09213 Supervision of pregnancy with history of pre-term labor, third trimester: Secondary | ICD-10-CM | POA: Diagnosis not present

## 2014-07-12 DIAGNOSIS — Z98891 History of uterine scar from previous surgery: Secondary | ICD-10-CM

## 2014-07-12 DIAGNOSIS — Z8279 Family history of other congenital malformations, deformations and chromosomal abnormalities: Secondary | ICD-10-CM | POA: Insufficient documentation

## 2014-07-12 DIAGNOSIS — O3421 Maternal care for scar from previous cesarean delivery: Secondary | ICD-10-CM | POA: Insufficient documentation

## 2014-07-12 NOTE — Consult Note (Signed)
MFM consult  28 yr old [id number] at 3970w6d referred by Dr. Debroah LoopArnold for consult secondary to 4 prior C sections.  Past OB hx: 2006 preterm labor; C section; child with Down Syndrome 2007 SAB 2008 full term C section 2008 preterm labor repeat C section twins 2011 full term C section- reports surgery was difficult and had large blood loss (althoug reports 1 liter blood loss and no transfusion needed)  No other significant history  I counseled the patient as follows: 1. Previous C sections: Repeat cesarean delivery is typically scheduled before the onset of labor to minimize the risk of adverse events such as uterine rupture, peripartum infection, emergent delivery, and term/postterm intrauterine demise. The optimum time for delivery balances these benefits with the risk of delivering an infant who could benefit from further in utero maturation. Since exposure to labor (even if prior to cesarean delivery) is associated with a lower rate of neonatal respiratory morbidity, it is particularly important to schedule planned cesarean deliveries when gestational age-related respiratory morbidity is minimal. For women with a prior lower uterine segment incision, this is about the 39th (390/7ths to 39 6/7ths) week of gestation. For women with a prior classical incision, the optimum time for delivery is in the 36th or 37th week of gestation (360/7ths to 376/7ths).  In women with one or more previous lower uterine segment hysterotomies, the optimum time for repeat cesarean delivery appears to be in the 39th week of gestation (390/7ths to 396/7ths). Compared to earlier (37 to 38 weeks) or later (?40 weeks) delivery, scheduled delivery during the 39th week results in a very low rate of adverse neonatal outcome while minimizing the risk that spontaneous labor will ensue; the risk of postterm stillbirth is completely avoided  Large observational studies have consistently shown that women who undergo multiple repeat  cesarean deliveries are at increased risk of maternal morbidity, and the risk increases with the number of cesarean deliveries. The risks of blood transfusion, cystotomy, and hysterectomy are each >1 percent after the third cesarean in one of the biggest series. However, there are insufficient data on which to base recommendations for the "safe" number of repeat cesarean deliveries.   The following are risks associated with previous C sections:  Complications relating to abnormal placentation -  an increasing number of prior cesarean deliveries was strongly associated with an increased risk of abnormal placentation. The risk of placenta previa after 1 and after ?3 cesarean deliveries was 05/999 and 28/1000, respectively. The risk of placenta accreta in women with placenta previa and no cesarean deliveries and after ?4 cesarean deliveries was 3 to 4 percent and 50 to 67 percent, respectively. Both of these placental disorders increase the risk of hemorrhage and the need for blood transfusion and hysterectomy.  Discussed overall risks of bleeding, surgical complications, and hysterectomy is increased and increases with the number of prior C sections. Placental abruption is another example of a placental abnormality that occurs more often in women with a prior cesarean birth. Complications relating to adhesion formation. The presence of dense adhesions can make the surgical procedure and fetal extraction more difficult, prolong the time to delivery, and increase the risk of surgical complications, such as bowel or bladder injury and excessive blood loss.  Other potential incisional complications include hysterotomy scar pregnancy, numbness/pain in the area of the skin incision, and incisional endometriosis.  Discussed all of the above implications of the her prior surgeries and if she were to have future pregnancies. I discussed tubal ligation with  the patient; she does not want this. She plans on having  an IUD placed postpartum.  Patient is very concerned for her and her baby's health; understandable so. However I tried to reassure her that the risk of significant morbidity and mortality is overall low and that the recommendations are clear for delivery at 2960w0d-[redacted]w[redacted]d- she is currently scheduled for 12/29 when she will be 11052w3d. Discussed I would recommend immediate delivery for signs/symptoms of labor, rupture of membranes, or regular painful contractions even without cervical change or otherwise indicated.  Gave patient strict precautions to present for evaluation if she has pain, contractions, vaginal bleeding, leaking of fluid, or decreased fetal movement.  Per patient Dr. Shawnie PonsPratt is scheduled to do her C section; the patient would like to meet her ahead of time which I told her to discuss with her group. Patient would also like cervical exams at the time of her prenatal visits to evaluate for labor which I do not feel is unreasonable. I offered a cervical exam today; patient declined.  I discussed her OB would be prepared for a potentially difficult surgery- given 4 prior C sections and reportedly difficult C section in her last pregnancy. Would recommend having uterotonics in the operating room and having an active T&S and consider a type and cross for 2 units prior to surgery.  All of the patient's questions were answered.  I spent a total of 60 minutes with the patient of which 100% was in face to face consultation.  Please call with questions.  Eulis FosterKristen Jinnifer Montejano, MD

## 2014-07-15 ENCOUNTER — Ambulatory Visit (INDEPENDENT_AMBULATORY_CARE_PROVIDER_SITE_OTHER): Payer: Medicaid Other | Admitting: Obstetrics & Gynecology

## 2014-07-15 VITALS — BP 106/58 | HR 78 | Temp 98.8°F | Wt 237.8 lb

## 2014-07-15 DIAGNOSIS — Z3483 Encounter for supervision of other normal pregnancy, third trimester: Secondary | ICD-10-CM

## 2014-07-15 LAB — POCT URINALYSIS DIP (DEVICE)
Bilirubin Urine: NEGATIVE
Glucose, UA: NEGATIVE mg/dL
HGB URINE DIPSTICK: NEGATIVE
Ketones, ur: NEGATIVE mg/dL
Leukocytes, UA: NEGATIVE
Nitrite: NEGATIVE
PH: 7 (ref 5.0–8.0)
Protein, ur: NEGATIVE mg/dL
Specific Gravity, Urine: 1.02 (ref 1.005–1.030)
UROBILINOGEN UA: 1 mg/dL (ref 0.0–1.0)

## 2014-07-15 LAB — OB RESULTS CONSOLE GBS: GBS: NEGATIVE

## 2014-07-15 NOTE — Progress Notes (Signed)
According to MFM, no need to deliver prior to 39 weeks. RCS scheduled at 6926w3d.   Patient wanted to be rescheduled to 3546w4d, she was told this was not possible given OR and surgeon availability; also not recommended. GBS culture checked today.    Patient wants to meet with Dr. Jolayne Pantheronstant (her primary surgeon); will meet her next week.  Declines flu and Tdap vaccines; had Tdap at 7 weeks after falling and hitting her head which led to a wound needing stitches and tetanus shot. Does not want repeat Tdap. No other complaints or concerns.  Labor and fetal movement precautions reviewed.

## 2014-07-15 NOTE — Patient Instructions (Signed)
Return to clinic for any obstetric concerns or go to MAU for evaluation  

## 2014-07-17 LAB — CULTURE, BETA STREP (GROUP B ONLY)

## 2014-07-22 ENCOUNTER — Encounter: Payer: Self-pay | Admitting: Obstetrics and Gynecology

## 2014-07-22 ENCOUNTER — Ambulatory Visit (INDEPENDENT_AMBULATORY_CARE_PROVIDER_SITE_OTHER): Payer: Medicaid Other | Admitting: Obstetrics and Gynecology

## 2014-07-22 VITALS — BP 117/60 | HR 89 | Temp 98.2°F | Wt 241.2 lb

## 2014-07-22 DIAGNOSIS — O34219 Maternal care for unspecified type scar from previous cesarean delivery: Secondary | ICD-10-CM

## 2014-07-22 DIAGNOSIS — O3421 Maternal care for scar from previous cesarean delivery: Secondary | ICD-10-CM

## 2014-07-22 DIAGNOSIS — Z3483 Encounter for supervision of other normal pregnancy, third trimester: Secondary | ICD-10-CM

## 2014-07-22 NOTE — Progress Notes (Signed)
Patient is doing well without complaints. FM/labor precautions reviewed. Patient scheduled for repeat cesarean section next week and informed to present to MAU prior to surgery date if fetal/labor concerns. She plans on using Mirena IUD for contraception.

## 2014-07-23 LAB — POCT URINALYSIS DIP (DEVICE)
Bilirubin Urine: NEGATIVE
Glucose, UA: NEGATIVE mg/dL
Hgb urine dipstick: NEGATIVE
Ketones, ur: NEGATIVE mg/dL
Leukocytes, UA: NEGATIVE
Nitrite: NEGATIVE
Protein, ur: NEGATIVE mg/dL
Specific Gravity, Urine: 1.015 (ref 1.005–1.030)
Urobilinogen, UA: 0.2 mg/dL (ref 0.0–1.0)
pH: 7 (ref 5.0–8.0)

## 2014-07-29 ENCOUNTER — Inpatient Hospital Stay (HOSPITAL_COMMUNITY): Payer: Medicaid Other | Admitting: Certified Registered"

## 2014-07-29 ENCOUNTER — Encounter (HOSPITAL_COMMUNITY): Payer: Self-pay

## 2014-07-29 ENCOUNTER — Inpatient Hospital Stay (HOSPITAL_COMMUNITY)
Admission: AD | Admit: 2014-07-29 | Discharge: 2014-08-01 | DRG: 765 | Disposition: A | Discharge status: Home / Self Care | Payer: Medicaid Other | Source: Ambulatory Visit | Attending: Obstetrics & Gynecology | Admitting: Obstetrics & Gynecology

## 2014-07-29 ENCOUNTER — Encounter (HOSPITAL_COMMUNITY)
Admission: RE | Admit: 2014-07-29 | Discharge: 2014-07-29 | Disposition: A | Discharge status: Home / Self Care | Payer: Medicaid Other | Source: Ambulatory Visit | Attending: Obstetrics and Gynecology | Admitting: Obstetrics and Gynecology

## 2014-07-29 ENCOUNTER — Encounter (HOSPITAL_COMMUNITY)
Admission: AD | Disposition: A | Discharge status: Home / Self Care | Payer: Self-pay | Source: Ambulatory Visit | Attending: Obstetrics & Gynecology

## 2014-07-29 ENCOUNTER — Inpatient Hospital Stay (HOSPITAL_COMMUNITY): Payer: Medicaid Other

## 2014-07-29 ENCOUNTER — Encounter (HOSPITAL_COMMUNITY): Payer: Self-pay | Admitting: *Deleted

## 2014-07-29 VITALS — BP 120/66 | HR 100 | Temp 98.9°F | Resp 18 | Ht 61.0 in | Wt 242.0 lb

## 2014-07-29 DIAGNOSIS — O9902 Anemia complicating childbirth: Secondary | ICD-10-CM | POA: Diagnosis present

## 2014-07-29 DIAGNOSIS — O99334 Smoking (tobacco) complicating childbirth: Secondary | ICD-10-CM | POA: Diagnosis present

## 2014-07-29 DIAGNOSIS — D6489 Other specified anemias: Secondary | ICD-10-CM | POA: Diagnosis present

## 2014-07-29 DIAGNOSIS — Z3A39 39 weeks gestation of pregnancy: Secondary | ICD-10-CM | POA: Diagnosis present

## 2014-07-29 DIAGNOSIS — O4593 Premature separation of placenta, unspecified, third trimester: Secondary | ICD-10-CM | POA: Diagnosis not present

## 2014-07-29 DIAGNOSIS — O3421 Maternal care for scar from previous cesarean delivery: Secondary | ICD-10-CM

## 2014-07-29 DIAGNOSIS — O368190 Decreased fetal movements, unspecified trimester, not applicable or unspecified: Secondary | ICD-10-CM

## 2014-07-29 DIAGNOSIS — O364XX0 Maternal care for intrauterine death, not applicable or unspecified: Secondary | ICD-10-CM | POA: Diagnosis present

## 2014-07-29 DIAGNOSIS — R109 Unspecified abdominal pain: Secondary | ICD-10-CM

## 2014-07-29 DIAGNOSIS — Z8279 Family history of other congenital malformations, deformations and chromosomal abnormalities: Secondary | ICD-10-CM

## 2014-07-29 DIAGNOSIS — O9A213 Injury, poisoning and certain other consequences of external causes complicating pregnancy, third trimester: Secondary | ICD-10-CM

## 2014-07-29 DIAGNOSIS — O26899 Other specified pregnancy related conditions, unspecified trimester: Secondary | ICD-10-CM | POA: Insufficient documentation

## 2014-07-29 DIAGNOSIS — Z3483 Encounter for supervision of other normal pregnancy, third trimester: Secondary | ICD-10-CM | POA: Diagnosis present

## 2014-07-29 DIAGNOSIS — O3680X0 Pregnancy with inconclusive fetal viability, not applicable or unspecified: Secondary | ICD-10-CM | POA: Insufficient documentation

## 2014-07-29 LAB — PREPARE RBC (CROSSMATCH)

## 2014-07-29 LAB — CBC
HCT: 32.7 % — ABNORMAL LOW (ref 36.0–46.0)
HCT: 30.3 % — ABNORMAL LOW (ref 36.0–46.0)
HCT: 24.6 % — ABNORMAL LOW (ref 36.0–46.0)
Hemoglobin: 11 g/dL — ABNORMAL LOW (ref 12.0–15.0)
Hemoglobin: 10.2 g/dL — ABNORMAL LOW (ref 12.0–15.0)
Hemoglobin: 8.2 g/dL — ABNORMAL LOW (ref 12.0–15.0)
MCH: 30.8 pg (ref 26.0–34.0)
MCH: 31.1 pg (ref 26.0–34.0)
MCH: 30.8 pg (ref 26.0–34.0)
MCHC: 33.6 g/dL (ref 30.0–36.0)
MCHC: 33.7 g/dL (ref 30.0–36.0)
MCHC: 33.3 g/dL (ref 30.0–36.0)
MCV: 91.6 fL (ref 78.0–100.0)
MCV: 92.4 fL (ref 78.0–100.0)
MCV: 92.5 fL (ref 78.0–100.0)
PLATELETS: 246 10*3/uL (ref 150–400)
PLATELETS: 212 10*3/uL (ref 150–400)
Platelets: 207 10*3/uL (ref 150–400)
RBC: 3.57 MIL/uL — AB (ref 3.87–5.11)
RBC: 3.28 MIL/uL — AB (ref 3.87–5.11)
RBC: 2.66 MIL/uL — ABNORMAL LOW (ref 3.87–5.11)
RDW: 13.5 % (ref 11.5–15.5)
RDW: 13.7 % (ref 11.5–15.5)
RDW: 13.8 % (ref 11.5–15.5)
WBC: 9.8 10*3/uL (ref 4.0–10.5)
WBC: 12.6 10*3/uL — AB (ref 4.0–10.5)
WBC: 22.2 10*3/uL — AB (ref 4.0–10.5)

## 2014-07-29 LAB — RPR

## 2014-07-29 SURGERY — Surgical Case
Anesthesia: General | Site: Abdomen

## 2014-07-29 MED ORDER — ACETAMINOPHEN 325 MG PO TABS: 325.0000 mg | ORAL_TABLET | ORAL | Status: DC | PRN

## 2014-07-29 MED ORDER — ONDANSETRON HCL 4 MG/2ML IJ SOLN
INTRAMUSCULAR | Status: AC
  Filled 2014-07-29: qty 2

## 2014-07-29 MED ORDER — GENTAMICIN SULFATE 40 MG/ML IJ SOLN
INTRAMUSCULAR | Status: AC
  Administered 2014-07-29: 100 mL via INTRAVENOUS
  Filled 2014-07-29: qty 13.75

## 2014-07-29 MED ORDER — PROPOFOL 10 MG/ML IV EMUL
INTRAVENOUS | Status: AC
  Filled 2014-07-29: qty 20

## 2014-07-29 MED ORDER — MIDAZOLAM HCL 2 MG/2ML IJ SOLN
INTRAMUSCULAR | Status: DC | PRN
  Administered 2014-07-29 (×2): 1 mg via INTRAVENOUS

## 2014-07-29 MED ORDER — ACETAMINOPHEN 160 MG/5ML PO SOLN: 325.0000 mg | ORAL | Status: DC | PRN

## 2014-07-29 MED ORDER — SODIUM BICARBONATE 8.4 % IV SOLN
INTRAVENOUS | Status: DC | PRN
  Administered 2014-07-29 (×4): 5 mL via EPIDURAL

## 2014-07-29 MED ORDER — PHENYLEPHRINE HCL 10 MG/ML IJ SOLN
20.0000 mg | INTRAMUSCULAR | Status: DC | PRN
  Administered 2014-07-29: 60 ug/min via INTRAVENOUS

## 2014-07-29 MED ORDER — HYDROMORPHONE HCL 1 MG/ML IJ SOLN: 0.2500 mg | INTRAMUSCULAR | Status: DC | PRN

## 2014-07-29 MED ORDER — CITRIC ACID-SODIUM CITRATE 334-500 MG/5ML PO SOLN
ORAL | Status: AC
  Filled 2014-07-29: qty 15

## 2014-07-29 MED ORDER — BUPIVACAINE HCL (PF) 0.5 % IJ SOLN
INTRAMUSCULAR | Status: AC
  Filled 2014-07-29: qty 30

## 2014-07-29 MED ORDER — LACTATED RINGERS IV SOLN
INTRAVENOUS | Status: DC | PRN
  Administered 2014-07-29 (×4): via INTRAVENOUS

## 2014-07-29 MED ORDER — KETOROLAC TROMETHAMINE 30 MG/ML IJ SOLN: 30.0000 mg | Freq: Once | INTRAMUSCULAR | Status: AC | PRN

## 2014-07-29 MED ORDER — SODIUM CHLORIDE 0.9 % IV SOLN
Freq: Once | INTRAVENOUS | Status: AC
  Administered 2014-07-29: 20:00:00 via INTRAVENOUS

## 2014-07-29 MED ORDER — PROMETHAZINE HCL 25 MG/ML IJ SOLN: 6.2500 mg | INTRAMUSCULAR | Status: DC | PRN

## 2014-07-29 MED ORDER — ONDANSETRON HCL 4 MG/2ML IJ SOLN
INTRAMUSCULAR | Status: DC | PRN
  Administered 2014-07-29: 4 mg via INTRAVENOUS

## 2014-07-29 MED ORDER — KETOROLAC TROMETHAMINE 30 MG/ML IJ SOLN: 30.0000 mg | Freq: Four times a day (QID) | INTRAMUSCULAR | Status: DC | PRN

## 2014-07-29 MED ORDER — MIDAZOLAM HCL 2 MG/2ML IJ SOLN
INTRAMUSCULAR | Status: AC
  Filled 2014-07-29: qty 2

## 2014-07-29 MED ORDER — MORPHINE SULFATE 0.5 MG/ML IJ SOLN
INTRAMUSCULAR | Status: AC
  Filled 2014-07-29: qty 10

## 2014-07-29 MED ORDER — DEXTROSE 5 % IV SOLN
30.0000 ug/min | INTRAVENOUS | Status: DC
Start: 2014-07-29 — End: 2014-07-30

## 2014-07-29 MED ORDER — OXYTOCIN 10 UNIT/ML IJ SOLN
40.0000 [IU] | INTRAVENOUS | Status: DC | PRN
  Administered 2014-07-29: 40 [IU] via INTRAVENOUS

## 2014-07-29 MED ORDER — BUPIVACAINE HCL (PF) 0.5 % IJ SOLN
INTRAMUSCULAR | Status: DC | PRN
  Administered 2014-07-29: 30 mL

## 2014-07-29 MED ORDER — MEPERIDINE HCL 25 MG/ML IJ SOLN: 6.2500 mg | INTRAMUSCULAR | Status: DC | PRN

## 2014-07-29 MED ORDER — PHENYLEPHRINE 8 MG IN D5W 100 ML (0.08MG/ML) PREMIX OPTIME
INJECTION | INTRAVENOUS | Status: DC | PRN
  Administered 2014-07-29: 60 ug/min via INTRAVENOUS

## 2014-07-29 MED ORDER — FENTANYL CITRATE 0.05 MG/ML IJ SOLN
INTRAMUSCULAR | Status: AC
  Filled 2014-07-29: qty 2

## 2014-07-29 MED ORDER — MORPHINE SULFATE (PF) 0.5 MG/ML IJ SOLN
INTRAMUSCULAR | Status: DC | PRN
  Administered 2014-07-29: 1 mg via INTRAVENOUS
  Administered 2014-07-29: 3 mg via EPIDURAL
  Administered 2014-07-29: 1 mg via INTRAVENOUS

## 2014-07-29 MED ORDER — PHENYLEPHRINE 8 MG IN D5W 100 ML (0.08MG/ML) PREMIX OPTIME
INJECTION | INTRAVENOUS | Status: AC
  Filled 2014-07-29: qty 100

## 2014-07-29 MED ORDER — LIDOCAINE HCL (CARDIAC) 20 MG/ML IV SOLN
INTRAVENOUS | Status: AC
  Filled 2014-07-29: qty 10

## 2014-07-29 MED ORDER — FENTANYL CITRATE 0.05 MG/ML IJ SOLN
INTRAMUSCULAR | Status: DC | PRN
  Administered 2014-07-29 (×2): 50 ug via INTRAVENOUS

## 2014-07-29 MED ORDER — LACTATED RINGERS IV SOLN
INTRAVENOUS | Status: DC | PRN
  Administered 2014-07-29: 21:00:00 via INTRAVENOUS

## 2014-07-29 MED ORDER — MIDAZOLAM HCL 2 MG/2ML IJ SOLN: 0.5000 mg | Freq: Once | INTRAMUSCULAR | Status: AC | PRN

## 2014-07-29 SURGICAL SUPPLY — 39 items
BENZOIN TINCTURE PRP APPL 2/3 (GAUZE/BANDAGES/DRESSINGS) ×3 IMPLANT
BINDER ABD UNIV 10 28-50 (GAUZE/BANDAGES/DRESSINGS) ×1 IMPLANT
BINDER ABD UNIV 12 45-62 (WOUND CARE) IMPLANT
BINDER ABDOM UNIV 10 (GAUZE/BANDAGES/DRESSINGS) ×3
BINDER ABDOMINAL 46IN 62IN (WOUND CARE)
CLAMP CORD UMBIL (MISCELLANEOUS) IMPLANT
CLOSURE WOUND 1/2 X4 (GAUZE/BANDAGES/DRESSINGS) ×1
CLOTH BEACON ORANGE TIMEOUT ST (SAFETY) ×3 IMPLANT
DRAPE SHEET LG 3/4 BI-LAMINATE (DRAPES) IMPLANT
DRSG OPSITE POSTOP 4X10 (GAUZE/BANDAGES/DRESSINGS) ×3 IMPLANT
DURAPREP 26ML APPLICATOR (WOUND CARE) ×3 IMPLANT
ELECT REM PT RETURN 9FT ADLT (ELECTROSURGICAL) ×3
ELECTRODE REM PT RTRN 9FT ADLT (ELECTROSURGICAL) ×1 IMPLANT
EXTRACTOR VACUUM KIWI (MISCELLANEOUS) IMPLANT
GLOVE BIO SURGEON STRL SZ7 (GLOVE) ×3 IMPLANT
GLOVE BIOGEL PI IND STRL 7.0 (GLOVE) ×1 IMPLANT
GLOVE BIOGEL PI INDICATOR 7.0 (GLOVE) ×2
GOWN STRL REUS W/TWL LRG LVL3 (GOWN DISPOSABLE) ×6 IMPLANT
KIT ABG SYR 3ML LUER SLIP (SYRINGE) IMPLANT
NEEDLE HYPO 22GX1.5 SAFETY (NEEDLE) ×3 IMPLANT
NEEDLE HYPO 25X5/8 SAFETYGLIDE (NEEDLE) IMPLANT
NS IRRIG 1000ML POUR BTL (IV SOLUTION) ×3 IMPLANT
PACK C SECTION WH (CUSTOM PROCEDURE TRAY) ×3 IMPLANT
PAD OB MATERNITY 4.3X12.25 (PERSONAL CARE ITEMS) ×3 IMPLANT
RTRCTR C-SECT PINK 25CM LRG (MISCELLANEOUS) ×3 IMPLANT
SPONGE SURGIFOAM ABS GEL 12-7 (HEMOSTASIS) IMPLANT
STAPLER VISISTAT 35W (STAPLE) IMPLANT
STRIP CLOSURE SKIN 1/2X4 (GAUZE/BANDAGES/DRESSINGS) ×2 IMPLANT
SUT PDS AB 0 CTX 60 (SUTURE) ×3 IMPLANT
SUT PLAIN 0 NONE (SUTURE) IMPLANT
SUT SILK 0 TIES 10X30 (SUTURE) IMPLANT
SUT VIC AB 0 CT1 36 (SUTURE) ×9 IMPLANT
SUT VIC AB 3-0 CT1 27 (SUTURE) ×2
SUT VIC AB 3-0 CT1 TAPERPNT 27 (SUTURE) ×1 IMPLANT
SUT VIC AB 4-0 KS 27 (SUTURE) ×3 IMPLANT
SYR CONTROL 10ML LL (SYRINGE) ×3 IMPLANT
TOWEL OR 17X24 6PK STRL BLUE (TOWEL DISPOSABLE) ×3 IMPLANT
TRAY FOLEY CATH 14FR (SET/KITS/TRAYS/PACK) ×3 IMPLANT
WATER STERILE IRR 1000ML POUR (IV SOLUTION) IMPLANT

## 2014-07-29 NOTE — Anesthesia Postprocedure Evaluation (Signed)
  Anesthesia Post Note  Patient: Cathy [name]  Procedure(s) Performed: Procedure(s) (LRB): CESAREAN SECTION (N/A)  Anesthesia type: Epidural  Patient location: PACU  Post pain: Pain level controlled  Post assessment: Post-op Vital signs reviewed  Last Vitals:  Filed Vitals:   07/29/14 2330  BP: 109/52  Pulse: 58  Temp:   Resp: 16    Post vital signs: Reviewed... Patient may need neo gtt for a short period of time in ICU  Level of consciousness: awake  Complications: No apparent anesthesia complications

## 2014-07-29 NOTE — MAU Note (Signed)
Patient presents with complaint that she passed out on the toilet and fell forward striking the middle of her forehead. Denies bleeding or discharge. States she has felt the baby move.

## 2014-07-29 NOTE — Op Note (Signed)
07/29/2014  10:12 PM  PATIENT:  Cathy [name]  28 y.o. female  PRE-OPERATIVE DIAGNOSIS:  Abruption, Intrauterine Fetal Demise  POST-OPERATIVE DIAGNOSIS:  Abruption, Intrauterine Fetal Demise  PROCEDURE:  Procedure(s): CESAREAN SECTION (N/A)  SURGEON:  Surgeon(s) and Role:    * Willodean Rosenthalarolyn Harraway-Smith, MD - Primary  ANESTHESIA:   epidural  EBL:  Total I/O In: 3800 [I.V.:3700; IV Piggyback:100] Out: 1525 [Urine:25; Blood:1500]  BLOOD ADMINISTERED:none  DRAINS: none   LOCAL MEDICATIONS USED:  MARCAINE     SPECIMEN:  Source of Specimen:  placenta  DISPOSITION OF SPECIMEN:  PATHOLOGY  COUNTS:  YES  TOURNIQUET:  * No tourniquets in log *  DICTATION: .Note written in EPIC  PLAN OF CARE: Admit to inpatient   PATIENT DISPOSITION:  PACU - hemodynamically stable.   Delay start of Pharmacological VTE agent (>24hrs) due to surgical blood loss or risk of bleeding: yes  Complications: none immediate   INDICATIONS: Cathy [name] is a 28 y.o. [phone] at 5774w2d here for cesarean section secondary to the indications listed under preoperative diagnosis; please see preoperative note for further details.  The risks of cesarean section were discussed with the patient including but were not limited to: bleeding which may require transfusion or reoperation; infection which may require antibiotics; injury to bowel, bladder, ureters or other surrounding organs; injury to the fetus; need for additional procedures including hysterectomy in the event of a life-threatening hemorrhage; placental abnormalities wth subsequent pregnancies, incisional problems, thromboembolic phenomenon and other postoperative/anesthesia complications.   The patient concurred with the proposed plan, giving informed written consent for the procedure.    FINDINGS:  Viable female infant in cephalic presentation.  Apgars 0 and 0.  Clear amniotic fluid.  The placenta was floating in the uterus and completely detached  and there was >1000cc of blood and clots in the uterine cavity, there was no nuchal cord.  The fallopian tubes and ovaries were normal bilaterally.  PROCEDURE IN DETAIL:  The patient preoperatively received intravenous antibiotics and had sequential compression devices applied to her lower extremities.  She was then taken to the operating room where spinal anesthesia was administered and was found to be adequate. She was then placed in a dorsal supine position with a leftward tilt, and prepped and draped in a sterile manner.  A foley catheter was placed into her bladder and attached to constant gravity.  After an adequate timeout was performed, a Pfannenstiel skin incision was made with scalpel and carried through to the underlying layer of fascia. The fascia was incised in the midline, and this incision was extended bilaterally using the Mayo scissors.  The fascia was densely adherent to the rectus muscles and the rectus muscles were transected using the Bovie. The peritoneum was entered bluntly.  Attention was turned to the lower uterine segment where a low transverse hysterotomy incision was made with a scalpel and extended bilaterally bluntly.  The infant was successfully delivered, the cord was clamped and cut and the infant was handed over to awaiting neonatology team. The placenta as stated above was delivered with >1 liter of clots and blood. The uterus was then cleared of clot and debris.  The hysterotomy was closed with 0 Vicryl in a running locked fashion, and an imbricating layer was also placed with the same suture. The uterus was returned to the pelvis. The pelvis was cleared of all clot and debris. Hemostasis was confirmed on all surfaces. The pelvic was irrigated with warmed normal saline. The peritoneum and  the muscles were reapproximated using 0 vicryl. The fascia was then closed using 0 looped PDS.  The subcutaneous layer was irrigated, then reapproximated with 3-0 vicryl in a running locked  fashion, and the skin was closed with a 4-0 Vicryl subcuticular stitch.  30 cc of 0.5% marcaine was injected into the incision and benzoin and steristrips were applied.  The patient tolerated the procedure well. Sponge, lap, instrument and needle counts were correct x 2.  She was taken to the recovery room in stable condition.

## 2014-07-29 NOTE — Transfer of Care (Signed)
Immediate Anesthesia Transfer of Care Note  Patient: Cathy [name]  Procedure(s) Performed: Procedure(s): CESAREAN SECTION (N/A)  Patient Location: PACU  Anesthesia Type:Epidural  Level of Consciousness: awake, alert  and oriented  Airway & Oxygen Therapy: Patient Spontanous Breathing and Patient connected to nasal cannula oxygen  Post-op Assessment: Report given to PACU RN and Post -op Vital signs reviewed and stable  Post vital signs: Reviewed and stable  Complications: No apparent anesthesia complications

## 2014-07-29 NOTE — MAU Note (Signed)
Radiology tech in to perform U/S.

## 2014-07-29 NOTE — H&P (Signed)
Cathy [name] is a 28 y.o. female presenting with complaints of abdominal pain and abdominal tightness.  Pt reports that she went to the [place] earlier this evening and felt dizzy.  She reports that she fell.  Her husband came to get her and brought her to the hospital. She reports that she last felt the baby move early this am but, not later today.  She denies contractions, vaginal bleeding or LOF. She reports eating a piece of fish at 6:30pm.  She reports having 4 prev c-sections.  Pt reports that she was very concerned with a uterine rupture.      Maternal Medical History:  Reason for admission: Abdominal pain and s/p fall  Fetal activity: Last perceived fetal movement was within the past 24 hours.   Last activity early morning  Prenatal complications: no prenatal complications   OB History    Gravida Para Term Preterm AB TAB SAB Ectopic Multiple Living   6 5 3 2 1  1  1  0     Past Medical History  Diagnosis Date  . Vaginal Pap smear, abnormal    Past Surgical History  Procedure Laterality Date  . Cesarean section      x4  . Ovarian cyst removal    . Dilation and curettage of uterus     Family History: family history is not on file. Social History:  reports that she has been smoking Cigarettes.  She has been smoking about 0.25 packs per day. She has never used smokeless tobacco. She reports that she does not drink alcohol or use illicit drugs. Review of Systems  Gastrointestinal: Positive for abdominal pain.  Neurological: Positive for dizziness.      Blood pressure 139/73, pulse 103, resp. rate 18.  Upon admission to the MAU the nurses were not able to obtain a FHR. I looked with the bedside sone and did not see fetal movement or a FHR.  i was concerned with a uterine rupture and an official sono confirmed an IUFD with a placental abruption.  The pt was hemodynamically stable with  Maternal Exam:  Abdomen: Patient reports generalized tenderness.  Surgical scars: low  transverse.   Fetal presentation: vertex     Fetal Exam Fetal Monitor Review: Mode: ultrasound.   Baseline rate: no FHR noted.   Fetal State Assessment: No fetal movement on bedside   Physical Exam  Constitutional: She is oriented to person, place, and time. She appears well-developed.  Cardiovascular: Normal rate.   Respiratory: Effort normal.  GI: There is generalized tenderness. There is guarding.  Musculoskeletal: Normal range of motion.  Neurological: She is oriented to person, place, and time.  Skin: Skin is warm.  Psychiatric: She has a normal mood and affect. Her behavior is normal. Thought content normal.    Prenatal labs: ABO, Rh: --/--/B POS (12/28 1138) Antibody: NEG (12/28 1138) Rubella:   RPR: NON REAC (12/28 1137)  HBsAg:    HIV: NONREACTIVE (11/25 1549)  GBS: Negative (12/14 0000)   Assessment/Plan: Intrauterine fetal demise with suspected uterine rupture and placental abruption. She is hemodymnamically stable.  Will proceed with STAT repeat cesarean section due to concern of intraabdominal bleeding. Patient desires surgical management with repeat cesarean section.  The risks of surgery were discussed in detail with the patient including but not limited to: bleeding which may require transfusion or reoperation; infection which may require prolonged hospitalization or re-hospitalization and antibiotic therapy; injury to bowel, bladder, ureters and major vessels or other surrounding organs;  need for additional procedures including laparotomy; thromboembolic phenomenon, incisional problems and other postoperative or anesthesia complications.  Patient was told that the likelihood that her condition and symptoms will be treated effectively with this surgical management was very high; the postoperative expectations were also discussed in detail. The patient also understands the alternative treatment options which were discussed in full. All questions were answered.     NOTICE: This is a late entry although entire history and physical was done prior to the STAT c-section  [name], [name] 07/29/2014, 9:41 PM

## 2014-07-29 NOTE — Anesthesia Procedure Notes (Addendum)
Epidural Patient location during procedure: OR Start time: 07/29/2014 8:42 PM  Staffing Anesthesiologist: Brayton CavesJACKSON, Deondra Wigger Performed by: anesthesiologist   Preanesthetic Checklist Completed: patient identified, site marked, surgical consent, pre-op evaluation, timeout performed, IV checked, risks and benefits discussed and monitors and equipment checked  Epidural Patient position: sitting Prep: site prepped and draped and DuraPrep Patient monitoring: continuous pulse ox and blood pressure Approach: midline Location: L3-L4 Injection technique: LOR air  Needle:  Needle type: Tuohy  Needle gauge: 17 G Needle length: 9 cm and 9 Needle insertion depth: 7 cm Catheter type: closed end flexible Catheter size: 19 Gauge Catheter at skin depth: 14 cm Test dose: negative  Assessment Events: blood not aspirated, injection not painful, no injection resistance, negative IV test and no paresthesia  Additional Notes Patient identified.  Risk benefits discussed including failed block, incomplete pain control, headache, nerve damage, paralysis, blood pressure changes, nausea, vomiting, reactions to medication both toxic or allergic, and postpartum back pain.  Patient expressed understanding and wished to proceed.  All questions were answered.  Sterile technique used throughout procedure and epidural site dressed with sterile barrier dressing. No paresthesia or other complications noted.The patient did not experience any signs of intravascular injection such as tinnitus or metallic taste in mouth nor signs of intrathecal spread such as rapid motor block. Please see nursing notes for vital signs.

## 2014-07-29 NOTE — Progress Notes (Signed)
CBC results reported to Dr Jean RosenthalJackson, told not to pull epidural until MD orders for in the morning. Phenylephrine drip at 8515mcg/min. Patient to be transferred to ICU.

## 2014-07-29 NOTE — Brief Op Note (Signed)
07/29/2014  10:12 PM  PATIENT:  Cathy [name]  28 y.o. female  PRE-OPERATIVE DIAGNOSIS:  Abruption, Intrauterine Fetal Demise  POST-OPERATIVE DIAGNOSIS:  Abruption, Intrauterine Fetal Demise  PROCEDURE:  Procedure(s): CESAREAN SECTION (N/A)  SURGEON:  Surgeon(s) and Role:    * Willodean Rosenthalarolyn Harraway-Smith, MD - Primary  ANESTHESIA:   epidural  EBL:  Total I/O In: 3800 [I.V.:3700; IV Piggyback:100] Out: 1525 [Urine:25; Blood:1500]  BLOOD ADMINISTERED:none  DRAINS: none   LOCAL MEDICATIONS USED:  MARCAINE     SPECIMEN:  Source of Specimen:  placenta  DISPOSITION OF SPECIMEN:  PATHOLOGY  COUNTS:  YES  TOURNIQUET:  * No tourniquets in log *  DICTATION: .Note written in EPIC  PLAN OF CARE: Admit to inpatient   PATIENT DISPOSITION:  PACU - hemodynamically stable.   Delay start of Pharmacological VTE agent (>24hrs) due to surgical blood loss or risk of bleeding: yes

## 2014-07-29 NOTE — Patient Instructions (Signed)
Your procedure is scheduled on:07/30/14  Enter through the Main Entrance at : 7:45 am Pick up desk phone and dial [phone] and inform us of your arrival.  Please call [phone] if you have any problems the morning of surgery.  Remember: Do not eat food or drink liquids, including water, after midnight:tonight   You may brush your teeth the morning of surgery.   DO NOT wear jewelry, eye make-up, lipstick,body lotion, or dark fingernail polish.  (Polished toes are ok) You may wear deodorant.  If you are to be admitted after surgery, leave suitcase in car until your room has been assigned. Patients discharged on the day of surgery will not be allowed to drive home. Wear loose fitting, comfortable clothes for your ride home.

## 2014-07-29 NOTE — Anesthesia Preprocedure Evaluation (Signed)
Anesthesia Evaluation  Patient identified by MRN, date of birth, ID band Patient awake    Reviewed: Allergy & Precautions, H&P , Patient's Chart, lab work & pertinent test results, reviewed documented beta blocker date and time   History of Anesthesia Complications Negative for: history of anesthetic complications  Airway Mallampati: III  TM Distance: >3 FB Neck ROM: full    Dental   Pulmonary Current Smoker,  breath sounds clear to auscultation        Cardiovascular Exercise Tolerance: Good Rhythm:regular Rate:Normal     Neuro/Psych    GI/Hepatic   Endo/Other  Morbid obesity  Renal/GU      Musculoskeletal   Abdominal   Peds  Hematology   Anesthesia Other Findings   Reproductive/Obstetrics                             Anesthesia Physical Anesthesia Plan  ASA: III and emergent  Anesthesia Plan: General ETT   Post-op Pain Management:    Induction: Rapid sequence and Cricoid pressure planned  Airway Management Planned: Oral ETT  Additional Equipment:   Intra-op Plan:   Post-operative Plan:   Informed Consent: I have reviewed the patients History and Physical, chart, labs and discussed the procedure including the risks, benefits and alternatives for the proposed anesthesia with the patient or authorized representative who has indicated his/her understanding and acceptance.   Dental Advisory Given  Plan Discussed with: CRNA and Surgeon  Anesthesia Plan Comments:         Anesthesia Quick Evaluation
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
## 2014-07-30 ENCOUNTER — Encounter (HOSPITAL_COMMUNITY): Payer: Self-pay | Admitting: *Deleted

## 2014-07-30 ENCOUNTER — Inpatient Hospital Stay (HOSPITAL_COMMUNITY)
Admission: RE | Admit: 2014-07-30 | Payer: Medicaid Other | Source: Ambulatory Visit | Admitting: Obstetrics and Gynecology

## 2014-07-30 ENCOUNTER — Encounter (HOSPITAL_COMMUNITY)
Admission: AD | Disposition: A | Discharge status: Home / Self Care | Payer: Self-pay | Source: Ambulatory Visit | Attending: Obstetrics & Gynecology

## 2014-07-30 DIAGNOSIS — Z3A39 39 weeks gestation of pregnancy: Secondary | ICD-10-CM | POA: Insufficient documentation

## 2014-07-30 DIAGNOSIS — O3680X0 Pregnancy with inconclusive fetal viability, not applicable or unspecified: Secondary | ICD-10-CM | POA: Insufficient documentation

## 2014-07-30 DIAGNOSIS — R109 Unspecified abdominal pain: Secondary | ICD-10-CM

## 2014-07-30 DIAGNOSIS — O4593 Premature separation of placenta, unspecified, third trimester: Secondary | ICD-10-CM | POA: Diagnosis present

## 2014-07-30 DIAGNOSIS — O26899 Other specified pregnancy related conditions, unspecified trimester: Secondary | ICD-10-CM | POA: Insufficient documentation

## 2014-07-30 LAB — CBC
HCT: 19.2 % — ABNORMAL LOW (ref 36.0–46.0)
HCT: 25.6 % — ABNORMAL LOW (ref 36.0–46.0)
HEMOGLOBIN: 8.9 g/dL — AB (ref 12.0–15.0)
Hemoglobin: 6.5 g/dL — CL (ref 12.0–15.0)
MCH: 31.1 pg (ref 26.0–34.0)
MCH: 31.2 pg (ref 26.0–34.0)
MCHC: 33.9 g/dL (ref 30.0–36.0)
MCHC: 34.8 g/dL (ref 30.0–36.0)
MCV: 91.9 fL (ref 78.0–100.0)
MCV: 89.8 fL (ref 78.0–100.0)
PLATELETS: 138 10*3/uL — AB (ref 150–400)
Platelets: 123 10*3/uL — ABNORMAL LOW (ref 150–400)
RBC: 2.09 MIL/uL — ABNORMAL LOW (ref 3.87–5.11)
RBC: 2.85 MIL/uL — AB (ref 3.87–5.11)
RDW: 13.4 % (ref 11.5–15.5)
RDW: 14.3 % (ref 11.5–15.5)
WBC: 12.2 10*3/uL — ABNORMAL HIGH (ref 4.0–10.5)
WBC: 10.4 10*3/uL (ref 4.0–10.5)

## 2014-07-30 LAB — RPR

## 2014-07-30 SURGERY — Surgical Case
Anesthesia: Regional

## 2014-07-30 MED ORDER — NALBUPHINE HCL 10 MG/ML IJ SOLN
5.0000 mg | INTRAMUSCULAR | Status: DC | PRN
  Administered 2014-07-30 (×2): 5 mg via INTRAVENOUS
  Filled 2014-07-30 (×2): qty 1

## 2014-07-30 MED ORDER — SIMETHICONE 80 MG PO CHEW
80.0000 mg | CHEWABLE_TABLET | ORAL | Status: DC
  Administered 2014-07-31 – 2014-08-01 (×2): 80 mg via ORAL
  Filled 2014-07-30 (×2): qty 1

## 2014-07-30 MED ORDER — SIMETHICONE 80 MG PO CHEW: 80.0000 mg | CHEWABLE_TABLET | ORAL | Status: DC | PRN

## 2014-07-30 MED ORDER — LACTATED RINGERS IV SOLN
INTRAVENOUS | Status: DC
  Administered 2014-07-30 (×2): via INTRAVENOUS

## 2014-07-30 MED ORDER — MENTHOL 3 MG MT LOZG: 1.0000 | LOZENGE | OROMUCOSAL | Status: DC | PRN

## 2014-07-30 MED ORDER — NALBUPHINE HCL 10 MG/ML IJ SOLN
5.0000 mg | Freq: Once | INTRAMUSCULAR | Status: AC | PRN
Start: 2014-07-30 — End: 2014-07-30

## 2014-07-30 MED ORDER — TETANUS-DIPHTH-ACELL PERTUSSIS 5-2.5-18.5 LF-MCG/0.5 IM SUSP
0.5000 mL | Freq: Once | INTRAMUSCULAR | Status: DC
  Filled 2014-07-30: qty 0.5

## 2014-07-30 MED ORDER — ONDANSETRON HCL 4 MG/2ML IJ SOLN
4.0000 mg | Freq: Three times a day (TID) | INTRAMUSCULAR | Status: DC | PRN
Start: 2014-07-30 — End: 2014-08-01

## 2014-07-30 MED ORDER — OXYCODONE-ACETAMINOPHEN 5-325 MG PO TABS
1.0000 | ORAL_TABLET | ORAL | Status: DC | PRN
  Administered 2014-07-30 – 2014-07-31 (×4): 1 via ORAL
  Filled 2014-07-30 (×4): qty 1

## 2014-07-30 MED ORDER — OXYCODONE-ACETAMINOPHEN 5-325 MG PO TABS
2.0000 | ORAL_TABLET | ORAL | Status: DC | PRN
  Administered 2014-07-31 – 2014-08-01 (×2): 2 via ORAL
  Filled 2014-07-30 (×2): qty 2

## 2014-07-30 MED ORDER — DIPHENHYDRAMINE HCL 50 MG/ML IJ SOLN
12.5000 mg | INTRAMUSCULAR | Status: DC | PRN
  Administered 2014-07-30 (×2): 12.5 mg via INTRAVENOUS
  Filled 2014-07-30 (×2): qty 1

## 2014-07-30 MED ORDER — NALOXONE HCL 1 MG/ML IJ SOLN
1.0000 ug/kg/h | INTRAMUSCULAR | Status: DC | PRN
  Filled 2014-07-30: qty 2

## 2014-07-30 MED ORDER — DIPHENHYDRAMINE HCL 25 MG PO CAPS
25.0000 mg | ORAL_CAPSULE | ORAL | Status: DC | PRN
  Administered 2014-07-30: 25 mg via ORAL
  Filled 2014-07-30 (×2): qty 1

## 2014-07-30 MED ORDER — NALOXONE HCL 0.4 MG/ML IJ SOLN: 0.4000 mg | INTRAMUSCULAR | Status: DC | PRN

## 2014-07-30 MED ORDER — WITCH HAZEL-GLYCERIN EX PADS: 1.0000 "application " | MEDICATED_PAD | CUTANEOUS | Status: DC | PRN

## 2014-07-30 MED ORDER — LANOLIN HYDROUS EX OINT: 1.0000 "application " | TOPICAL_OINTMENT | CUTANEOUS | Status: DC | PRN

## 2014-07-30 MED ORDER — SODIUM CHLORIDE 0.9 % IV SOLN
Freq: Once | INTRAVENOUS | Status: AC
  Administered 2014-07-30: 07:00:00 via INTRAVENOUS

## 2014-07-30 MED ORDER — ACETAMINOPHEN 500 MG PO TABS
1000.0000 mg | ORAL_TABLET | Freq: Four times a day (QID) | ORAL | Status: AC
  Administered 2014-07-30 (×4): 1000 mg via ORAL
  Filled 2014-07-30 (×4): qty 2

## 2014-07-30 MED ORDER — SIMETHICONE 80 MG PO CHEW
80.0000 mg | CHEWABLE_TABLET | Freq: Three times a day (TID) | ORAL | Status: DC
  Administered 2014-07-30 – 2014-08-01 (×7): 80 mg via ORAL
  Filled 2014-07-30 (×7): qty 1

## 2014-07-30 MED ORDER — IBUPROFEN 600 MG PO TABS
600.0000 mg | ORAL_TABLET | Freq: Four times a day (QID) | ORAL | Status: DC
  Administered 2014-07-31 – 2014-08-01 (×5): 600 mg via ORAL
  Filled 2014-07-30 (×7): qty 1

## 2014-07-30 MED ORDER — PNEUMOCOCCAL VAC POLYVALENT 25 MCG/0.5ML IJ INJ
0.5000 mL | INJECTION | INTRAMUSCULAR | Status: DC
  Filled 2014-07-30 (×2): qty 0.5

## 2014-07-30 MED ORDER — ONDANSETRON HCL 4 MG/2ML IJ SOLN: 4.0000 mg | INTRAMUSCULAR | Status: DC | PRN

## 2014-07-30 MED ORDER — OXYTOCIN 40 UNITS IN LACTATED RINGERS INFUSION - SIMPLE MED: 62.5000 mL/h | INTRAVENOUS | Status: AC

## 2014-07-30 MED ORDER — SODIUM CHLORIDE 0.9 % IJ SOLN: 3.0000 mL | INTRAMUSCULAR | Status: DC | PRN

## 2014-07-30 MED ORDER — DIBUCAINE 1 % RE OINT: 1.0000 "application " | TOPICAL_OINTMENT | RECTAL | Status: DC | PRN

## 2014-07-30 MED ORDER — SENNOSIDES-DOCUSATE SODIUM 8.6-50 MG PO TABS
2.0000 | ORAL_TABLET | ORAL | Status: DC
  Administered 2014-07-31 – 2014-08-01 (×2): 2 via ORAL
  Filled 2014-07-30 (×2): qty 2

## 2014-07-30 MED ORDER — SCOPOLAMINE 1 MG/3DAYS TD PT72
1.0000 | MEDICATED_PATCH | Freq: Once | TRANSDERMAL | Status: DC
  Filled 2014-07-30: qty 1

## 2014-07-30 MED ORDER — NALBUPHINE HCL 10 MG/ML IJ SOLN: 5.0000 mg | INTRAMUSCULAR | Status: DC | PRN

## 2014-07-30 MED ORDER — ZOLPIDEM TARTRATE 5 MG PO TABS
5.0000 mg | ORAL_TABLET | Freq: Every evening | ORAL | Status: DC | PRN
  Administered 2014-08-01: 5 mg via ORAL
  Filled 2014-07-30: qty 1

## 2014-07-30 MED ORDER — PRENATAL MULTIVITAMIN CH
1.0000 | ORAL_TABLET | Freq: Every day | ORAL | Status: DC
  Administered 2014-07-30 – 2014-07-31 (×2): 1 via ORAL
  Filled 2014-07-30 (×2): qty 1

## 2014-07-30 MED ORDER — MEASLES, MUMPS & RUBELLA VAC ~~LOC~~ INJ
0.5000 mL | INJECTION | Freq: Once | SUBCUTANEOUS | Status: DC
  Filled 2014-07-30: qty 0.5

## 2014-07-30 MED ORDER — IBUPROFEN 600 MG PO TABS: 600.0000 mg | ORAL_TABLET | Freq: Four times a day (QID) | ORAL | Status: DC | PRN

## 2014-07-30 MED ORDER — DIPHENHYDRAMINE HCL 25 MG PO CAPS
25.0000 mg | ORAL_CAPSULE | Freq: Four times a day (QID) | ORAL | Status: DC | PRN
  Administered 2014-07-30: 25 mg via ORAL

## 2014-07-30 MED ORDER — ONDANSETRON HCL 4 MG PO TABS: 4.0000 mg | ORAL_TABLET | ORAL | Status: DC | PRN

## 2014-07-30 SURGICAL SUPPLY — 27 items
CLAMP CORD UMBIL (MISCELLANEOUS) IMPLANT
CONTAINER PREFILL 10% NBF 15ML (MISCELLANEOUS) IMPLANT
DRAPE SHEET LG 3/4 BI-LAMINATE (DRAPES) IMPLANT
DRSG OPSITE POSTOP 4X10 (GAUZE/BANDAGES/DRESSINGS) IMPLANT
DURAPREP 26ML APPLICATOR (WOUND CARE) IMPLANT
ELECT REM PT RETURN 9FT ADLT (ELECTROSURGICAL)
ELECTRODE REM PT RTRN 9FT ADLT (ELECTROSURGICAL) IMPLANT
EXTRACTOR VACUUM M CUP 4 TUBE (SUCTIONS) IMPLANT
EXTRACTOR VACUUM M CUP 4' TUBE (SUCTIONS)
GLOVE BIOGEL PI IND STRL 6.5 (GLOVE) IMPLANT
GLOVE BIOGEL PI INDICATOR 6.5 (GLOVE)
GLOVE SURG SS PI 6.0 STRL IVOR (GLOVE) IMPLANT
GOWN STRL REUS W/TWL LRG LVL3 (GOWN DISPOSABLE) IMPLANT
KIT ABG SYR 3ML LUER SLIP (SYRINGE) IMPLANT
NEEDLE HYPO 25X5/8 SAFETYGLIDE (NEEDLE) IMPLANT
NS IRRIG 1000ML POUR BTL (IV SOLUTION) IMPLANT
PACK C SECTION WH (CUSTOM PROCEDURE TRAY) IMPLANT
PAD OB MATERNITY 4.3X12.25 (PERSONAL CARE ITEMS) IMPLANT
RTRCTR C-SECT PINK 25CM LRG (MISCELLANEOUS) IMPLANT
SEPRAFILM MEMBRANE 5X6 (MISCELLANEOUS) IMPLANT
STAPLER VISISTAT 35W (STAPLE) IMPLANT
SUT PLAIN 0 NONE (SUTURE) IMPLANT
SUT VIC AB 0 CT1 36 (SUTURE) IMPLANT
SUT VIC AB 4-0 KS 27 (SUTURE) IMPLANT
TOWEL OR 17X24 6PK STRL BLUE (TOWEL DISPOSABLE) IMPLANT
TRAY FOLEY CATH 14FR (SET/KITS/TRAYS/PACK) IMPLANT
WATER STERILE IRR 1000ML POUR (IV SOLUTION) IMPLANT

## 2014-07-30 NOTE — Addendum Note (Signed)
Addendum  created 07/30/14 0808 by Elbert Ewingsolleen S Bowe Sidor, CRNA   Modules edited: Anesthesia Events, Narrator, Notes Section   Narrator:  Narrator: Event Log Edited   Notes Section:  File: [id number]

## 2014-07-30 NOTE — Anesthesia Postprocedure Evaluation (Signed)
  Anesthesia Post-op Note  Patient: Cathy [name]  Procedure(s) Performed: Procedure(s): CESAREAN SECTION (N/A)  Patient Location: PACU and A-ICU  Anesthesia Type:Epidural  Level of Consciousness: awake, alert  and oriented  Airway and Oxygen Therapy: Patient Spontanous Breathing  Post-op Pain: mild  Post-op Assessment: Post-op Vital signs reviewed, Patient's Cardiovascular Status Stable, Respiratory Function Stable, No signs of Nausea or vomiting, Pain level controlled, No headache, No backache, No residual numbness and No residual motor weakness  Post-op Vital Signs: Reviewed and stable  Last Vitals:  Filed Vitals:   07/30/14 0700  BP: 121/61  Pulse: 76  Temp: 37.3 C  Resp: 20    Complications: No apparent anesthesia complications

## 2014-07-30 NOTE — Progress Notes (Signed)
Results for Cathy [name], Cathy [name] (MRN [id number]) as of 07/30/2014 06:02  Ref. Range 07/30/2014 05:37  WBC Latest Range: 4.0-10.5 K/uL 12.2 (H)  RBC Latest Range: 3.87-5.11 MIL/uL 2.09 (L)  Hemoglobin Latest Range: 12.0-15.0 g/dL 6.5 (LL)  HCT Latest Range: 36.0-46.0 % 19.2 (L)  MCV Latest Range: 78.0-100.0 fL 91.9  MCH Latest Range: 26.0-34.0 pg 31.1  MCHC Latest Range: 30.0-36.0 g/dL 40.933.9  RDW Latest Range: 11.5-15.5 % 13.4  Platelets Latest Range: 150-400 K/uL 138 (L)   Report to Dr. Erin FullingHarraway-Smith - will administer 2 units PRBC

## 2014-07-30 NOTE — Progress Notes (Signed)
Spent a good portion of day w/family. Pt's husband and husband's grandmother were bedside when I first arrived. They [facility]. They were very shaken, heartbroken and upset as they described the weeks that they felt led up to their loss and the visits to the doctors outside of the hospital. I listened empathically as they described how they felt they were not heard when mother said she did not feel right. Mother began crying. I offered support and tried to encourage her and let her know she had done the right things by talking to her doctors and taking care of herself. We talked about her grieving and crying and I tried to assure her it was normal and grieving will take a while. They expressed how they were concerned about finances to bury their baby.  Additional family members arrived after I left the room for a few minutes and returned. Pt's father, stepfather, grandmother and three sons were bedside. Pt wanted visit w/baby's remains. Stayed and offered emotional spt to family during visit. All family members (including young boys) were appropriately tearful. One of the young boys asked for prayer, and dad wanted to prayed and offered a beautiful prayer asking for strength for the family. Grandmothers were very tearful. Pt's father was also very emotional as were parents. Pt needed on-going spt as father had to leave the room. Continued to offer support during pictures. A few minutes later I heard pt crying and becoming distressed and overheard grandmother telling pt she needed to get back into church. I quietly tried to encourage grandmother to talk with pt about that at another time. Pt was starting to feel guilty. Nurse and I tried to assure pt this was not her fault. Pt's dad misunderstood the conversation and was getting upset thinking we were upsetting the pt. Nurse Dorene Grebe(Natalie) and I assured him we were offering support and her  crying was normal and natural. Pt's family seemed to calm emotionally and I excused myself to allow them to continue to visit as they wished.  Marjory Liesamela Carrington Holder Chaplain   07/30/14 1500  Clinical Encounter Type  Visited With Patient and family together

## 2014-07-30 NOTE — Progress Notes (Signed)
Chaplain paged at 2125 Monday 29 July 2014. Chaplain arrived the Recovery at or about 2130.   Chaplain assisted Ms Cathy KnifeWilliams and her husband Cathy DanceKeith deal with the death of their full term baby daughter. Presently their grief is overwhelming. Adding to her grief is Ms Cathy KnifeWilliams anger at those whom she told that she needed to be induced but who she feels did not listen. When asked if any of those people were on our staff, she said no. She went on to say that everyone who has cared for her during this trying time as been excellent in their care.  RECOMMEND: Chaplain follow-up as soon as possible during the morning of 30 July 2014, to deal with spiritual and emotional issues which shortly after delivery were too painful to deal with.  Page chaplain immediately if Ms Cathy KnifeWilliams requests or needs further spiritual/emotion care.  Benjie Karvonenharles D. Ibrahem Volkman, DMin, MDiv, MA Chaplain

## 2014-07-30 NOTE — Progress Notes (Signed)
Subjective: Postpartum Day 1: Cesarean Delivery Patient reports "I'm depressed"  She reports that she has adequate pain control.  She has no dizziness or SOB.    Pt hit head yesterday with fall.  Denied LOC at the time. She denies pain from the area. Objective: Vital signs in last 24 hours: Temp:  [97.5 F (36.4 C)-99.1 F (37.3 C)] 99.1 F (37.3 C) (12/29 0700) Pulse Rate:  [58-122] 76 (12/29 0700) Resp:  [14-32] 20 (12/29 0700) BP: (64-139)/(24-73) 121/61 mmHg (12/29 0700) SpO2:  [94 %-100 %] 97 % (12/29 0700) Weight:  [230 lb (104.327 kg)-242 lb (109.77 kg)] 230 lb (104.327 kg) (12/29 0031)  Physical Exam:  General: alert and no distress She is moving about in bed without difficulty HEENT:  Small lump on forehead yesterday is improved.  Lochia: appropriate Uterine Fundus: firm Incision: dressing changed earlier but now clean and dry. DVT Evaluation: No evidence of DVT seen on physical exam.   Recent Labs  07/29/14 2255 07/30/14 0537  HGB 8.2* 6.5*  HCT 24.6* 19.2*   CBC Latest Ref Rng 07/30/2014 07/29/2014 07/29/2014  WBC 4.0 - 10.5 K/uL 12.2(H) 22.2(H) 12.6(H)  Hemoglobin 12.0 - 15.0 g/dL 6.5(LL) 8.2(L) 10.2(L)  Hematocrit 36.0 - 46.0 % 19.2(L) 24.6(L) 30.3(L)  Platelets 150 - 400 K/uL 138(L) 212 207    I/O last 3 completed shifts: In: 5791.8 [P.O.:480; I.V.:5211.8; IV Piggyback:100] Out: 3350 [Urine:1850; Blood:1500]     Assessment/Plan: Pt is post op day #1 status post Cesarean section for placental abruption and fetal demise. Postoperative course complicated by anemia.  suspect due to blood loss due to abruption.   she is currently receiving 2 units of PRBC's.  She has good UO and is hemodynamically stable Recheck CBC 4 hours post transfusion.  Pt will speak to Spiritual care again today If stable will transfer to the Women's unit later today  [name], Cathy Routh 07/30/2014, 7:07 AM

## 2014-07-31 ENCOUNTER — Encounter (HOSPITAL_COMMUNITY): Payer: Self-pay | Admitting: Obstetrics & Gynecology

## 2014-07-31 LAB — TYPE AND SCREEN
ABO/RH(D): B POS
ABO/RH(D): B POS
Antibody Screen: NEGATIVE
Antibody Screen: NEGATIVE
UNIT DIVISION: 0
UNIT DIVISION: 0
Unit division: 0
Unit division: 0

## 2014-07-31 LAB — CBC
HCT: 24.9 % — ABNORMAL LOW (ref 36.0–46.0)
Hemoglobin: 8.6 g/dL — ABNORMAL LOW (ref 12.0–15.0)
MCH: 30.8 pg (ref 26.0–34.0)
MCHC: 34.5 g/dL (ref 30.0–36.0)
MCV: 89.2 fL (ref 78.0–100.0)
Platelets: 138 10*3/uL — ABNORMAL LOW (ref 150–400)
RBC: 2.79 MIL/uL — ABNORMAL LOW (ref 3.87–5.11)
RDW: 14.2 % (ref 11.5–15.5)
WBC: 10.8 10*3/uL — ABNORMAL HIGH (ref 4.0–10.5)

## 2014-07-31 MED ORDER — SODIUM CHLORIDE 0.9 % IJ SOLN
3.0000 mL | INTRAMUSCULAR | Status: DC | PRN
  Administered 2014-07-31 (×2): 3 mL via INTRAVENOUS
  Filled 2014-07-31 (×2): qty 3

## 2014-07-31 MED ORDER — GENTAMICIN SULFATE 40 MG/ML IJ SOLN
Freq: Three times a day (TID) | INTRAMUSCULAR | Status: DC
  Administered 2014-07-31 – 2014-08-01 (×4): via INTRAVENOUS
  Filled 2014-07-31 (×4): qty 3.5

## 2014-07-31 MED ORDER — SODIUM CHLORIDE 0.9 % IJ SOLN: 3.0000 mL | Freq: Two times a day (BID) | INTRAMUSCULAR | Status: DC

## 2014-07-31 MED ORDER — CLINDAMYCIN PHOSPHATE 900 MG/50ML IV SOLN: 900.0000 mg | Freq: Three times a day (TID) | INTRAVENOUS | Status: DC

## 2014-07-31 MED ORDER — SODIUM CHLORIDE 0.9 % IV SOLN
250.0000 mL | INTRAVENOUS | Status: DC | PRN
  Administered 2014-07-31: 250 mL via INTRAVENOUS

## 2014-07-31 NOTE — Progress Notes (Signed)
CSW met with patient and husband to offer support while they continue to process and grieve the loss of their baby daughter.  Parents were receptive to CSW intervention.  CSW provided active listening and brief grief counseling as parents shared their story and showed pictures of their daughter and three oldest sons.  Patient is very concerned about how her sons (76, 22, 71, and 4) will cope with the loss of their sister.  CSW strongly recommends enrolling their sons in grief counseling at [facility] to help them and also assist the parents in knowing how to handle their sons' grief.  CSW talked about the importance of including their sons in the grief process, but also the resiliency of children.  Patient states she does not know how she is going to go on and that she feels like she wishes she had died with her daughter so that she would not be feeling this intense emotional pain.  CSW validated these feelings, although her husband thinks these feelings are selfish.  CSW explained that this is a normal part of grief and continually discussed how grief is a process.  CSW explained that wanting to die in order to not feel pain and having a plan to kill one's self to escape pain are two very different things that CSW felt were extremely important to talk about.  CSW ensured that CSW got both parents full attention.  CSW made patient contract for safety if she begins feeling suicidal.  She agreed.  CSW recommends grief counseling at [facility] to parents.  Patient agrees.  Husband states he is "fine" and does not want to talk about it.  Patient states he bottles things up, which she does not feel is healthy.  This was gently discussed and patient's husband agreed to support patient in counseling.  Patient stated understanding that people grieve differently and move through the process at different speeds.  CSW also recommends [facility] at 3-5 months after the initial loss.  CSW gave brochures for [facility]  and [facility].  With patient's permission, CSW called [employer] to inform her of the situation and ask that a counselor contact the parents.  CSW helped couple discuss funeral arrangements and spoke with Ms. Boone-Carroll at [facility] to inform her that the family has chosen [employer] to provide funeral services.  Ms. Josetta Huddle has offered to come meet with the couple at the hospital at [facility] on 07/31/14.  Couple agrees and is Patent attorney.  CSW is available for ongoing support while patient is in the hospital.

## 2014-07-31 NOTE — Progress Notes (Signed)
Subjective: Postpartum Day 2: Cesarean Delivery Patient reports incisional pain, tolerating PO and no problems voiding.    Objective: Vital signs in last 24 hours: Temp:  [98.5 F (36.9 C)-100.3 F (37.9 C)] 98.7 F (37.1 C) (12/30 0850) Pulse Rate:  [71-110] 87 (12/30 0449) Resp:  [18-22] 22 (12/30 0449) BP: (117-149)/(52-86) 125/62 mmHg (12/30 0450) SpO2:  [97 %-100 %] 100 % (12/30 0449)  Physical Exam:  General: alert, cooperative and appears stated age Lochia: appropriate Uterine Fundus: firm Incision: no significant drainage DVT Evaluation: No evidence of DVT seen on physical exam.   Recent Labs  07/30/14 1650 07/31/14 0539  HGB 8.9* 8.6*  HCT 25.6* 24.9*    Assessment/Plan:  Status post Cesarean section. Doing well postoperatively.  Continue current care. Given low grade fever--will give 24 hours of antibiotics Transfer to floor.  Cathy [name] S 07/31/2014, 9:38 AM

## 2014-07-31 NOTE — Progress Notes (Signed)
Pt was awake and lying in bed when I arrived. Pt's husband was bedside. Pt seemed to be emotionally better today, although she was still tearful as she recounted the past 24 hours of trauma a loss. She said the dr came by and she was able to express how she felt. She said she feels angry but she did not want to be disrespectful.  She said the dr offered to pay for final expenses and burial. Pt also described stressful family dynamics saying her mother was "crazy" and drinks and she is very stressful to be around. She was tearful in saying her mother had not been here to visit her and said her mother said it was too much. She said, "if it is too much for her, how does she think I feel!?!"  Pt's mother called during visit and said she was on her way. Then pt expressed concerns that mother may be intoxicated when she arrived. Pt expressed several times she doesn't know what to do... She said she doesn't know what to do with the baby's clothes. She doesn't know how she is going to say good bye at the funeral and she doesn't know how she is going to bury her. We talked about how painful and difficult grieving is and how important it is. We talked about how it may come in waves. We also talked about waiting until she is ready before making decisions about the baby's belongings. She appreciated the opportunity to talk through so many uncertainties. CSW Cathy [name] is working w/family and funeral home to coordinate plans for burial service. Will follow-up as needed. Cathy Liesamela Carrington [name] Chaplain   07/31/14 1400  Clinical Encounter Type  Visited With Patient and family together

## 2014-07-31 NOTE — Progress Notes (Signed)
ANTIBIOTIC CONSULT NOTE - INITIAL  Pharmacy Consult for Gentamicin Indication: Endometritis  Allergies  Allergen Reactions  . Penicillins Other (See Comments)    Unknown childhood reaction.  . Latex Itching, Swelling and Rash    Patient Measurements: Height: 5\' 1"  (154.9 cm) Weight: 230 lb (104.327 kg) IBW/kg (Calculated) : 47.8kg Adjusted Body Weight: 65 kg  Vital Signs: Temp: 98.7 F (37.1 C) (12/30 0850) Temp Source: Oral (12/30 0850) BP: 117/65 mmHg (12/30 0900) Pulse Rate: 86 (12/30 0900)  Labs:  Recent Labs  07/30/14 0537 07/30/14 1650 07/31/14 0539  WBC 12.2* 10.4 10.8*  HGB 6.5* 8.9* 8.6*  PLT 138* 123* 138*      Microbiology: Recent Results (from the past 720 hour(s))  GC/Chlamydia Probe Amp     Status: None   Collection Time: 07/10/14 11:09 AM  Result Value Ref Range Status   CT Probe RNA NEGATIVE  Final   GC Probe RNA NEGATIVE  Final    Comment:                                                                                         **Normal Reference Range: Negative**         Assay performed using the Gen-Probe APTIMA COMBO2 (R) Assay.   Acceptable specimen types for this assay include APTIMA Swabs (Unisex, endocervical, urethral, or vaginal), first void urine, and ThinPrep liquid based cytology samples.   OB RESULT CONSOLE Group B Strep     Status: None   Collection Time: 07/15/14 12:00 AM  Result Value Ref Range Status   GBS Negative  Final  Culture, beta strep (group b only)     Status: None   Collection Time: 07/15/14 10:50 AM  Result Value Ref Range Status   Organism ID, Bacteria NO GROUP B STREP (S.AGALACTIAE) ISOLATED  Final    Medications:  Clindamycin 900 mg IV Q 8 hr  Assessment: 28 y.o. female [id number] at 6860w2d s/p stat C-section for abruption now with low grade fever Estimated Ke = 0.277, Vd = 0.32 L/kg  Goal of Therapy:  Gentamicin peak 6-8 mg/L and Trough < 1 mg/L  Plan:  Gentamicin 140 mg IV every 8 hrs  Check Scr  with next labs if gentamicin continued. Will check gentamicin levels if continued > 72hr or clinically indicated.  Natasha Benceline, Shaheem Pichon 07/31/2014,10:12 AM

## 2014-08-01 MED ORDER — OXYCODONE-ACETAMINOPHEN 5-325 MG PO TABS: 1.0000 | ORAL_TABLET | ORAL | Status: DC | PRN

## 2014-08-01 MED ORDER — IBUPROFEN 600 MG PO TABS: 600.0000 mg | ORAL_TABLET | Freq: Four times a day (QID) | ORAL | Status: DC | PRN
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
## 2014-08-01 NOTE — Progress Notes (Signed)
Pt discharged home with husband... Discharge instructions reviewed with pt and she verbalized understanding, and is aware to follow up with clinic for incision check on Monday... Condition stable... No equipment... Ambulated to car with A. Sheria Langameron, NT.
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
## 2014-08-01 NOTE — Discharge Instructions (Signed)
Laparotomy °Care After °Refer to this sheet in the next few weeks. These instructions provide you with information on caring for yourself after your procedure. Your caregiver may also give you more specific instructions. Your treatment has been planned according to current medical practices, but problems sometimes occur. Call your caregiver if you have any problems or questions after your procedure. °HOME CARE INSTRUCTIONS °ACTIVITY °· Rest as much as possible the first two weeks at home. °· Avoid strenuous activity such as heavy lifting (more than 10 pounds), pushing, or pulling. Limit stair climbing to once or twice a day for the first week, then slowly increase this activity. °· Take frequent rest periods throughout the day. °· Talk with your caregiver about when you may resume your usual physical activity. °· You need to be out of bed and walking as much as possible. This decreases the chance of: °¨ Blood clots. °¨ Pneumonia. °NUTRITION °· You can resume your normal diet once you regain bowel function. °· Drink plenty of fluids (6-8 glasses a day or as instructed by your caregiver). °· Eat a well-balanced diet. °· Daily portions of food from the meat (protein), milk, vegetable, and bread groups are necessary for your health. °ELIMINATION °It is very important not to strain during bowel movements. If constipation should occur, you may: °· Take a mild laxative. °· Add fruit and bran to your diet. °· Drink more fluids. °HYGIENE °· Take showers, not baths, until 4-6 weeks after surgery. °· If your incision is closed, you may take a shower or tub bath. °FEVER °If you feel feverish or have shaking chills, take your temperature. If it is 102° F (38.9° C), call your caregiver. The fever may mean there is an infection. °PAIN CONTROL °· Mild discomfort may occur. °· Only take over-the-counter or prescription medicines for pain, discomfort, or fever as directed by your caregiver. Take any prescribed medicines exactly as  directed. °INCISION CARE °· Keep your incision site clean with soap and water. °· Do not use a dressing unless your cut (incision) from surgery is draining or irritated. °· If you have small adhesive strips in place and they do not fall off within 10 days, carefully peel them off. °· Check your incision and surrounding area daily for any redness, swelling, discoloration, heavy drainage, or separation of the skin. °SEXUAL INTERCOURSE °Do not have sexual intercourse until after your follow-up appointment, unless your caregiver tells you otherwise. °SEEK MEDICAL CARE IF:  °· You are unable to tolerate food or drinks. °· You are unable to pass gas or have a bowel movement. °· Your pain becomes more severe or is not relieved with medicines. °· You have redness, swelling, discoloration, heavy drainage, or separation of the skin at the incision site. °Document Released: 03/02/2004 Document Revised: 07/05/2012 Document Reviewed: 07/18/2007 °ExitCare® Patient Information ©2015 [employer]. This information is not intended to replace advice given to you by your health care provider. Make sure you discuss any questions you have with your health care provider. ° °

## 2014-08-05 ENCOUNTER — Telehealth: Payer: Self-pay | Admitting: Family Medicine

## 2014-08-05 ENCOUNTER — Ambulatory Visit: Payer: Medicaid Other | Admitting: Family Medicine

## 2014-08-05 ENCOUNTER — Telehealth: Payer: Self-pay

## 2014-08-05 NOTE — Telephone Encounter (Signed)
Called patient to see if she would be willing to come in on Friday to see Dr. Adrian Blackwater. Patient stated this was not a good time, and hung up the phone.

## 2014-08-05 NOTE — Telephone Encounter (Signed)
Patient missed f/u appointment today for incision check and PP depression help with Dr. Adrian Blackwater. Attempted to contact patient, mailbox full and unable to leave message.

## 2014-08-06 ENCOUNTER — Ambulatory Visit: Payer: Medicaid Other

## 2014-08-06 VITALS — Temp 99.4°F

## 2014-08-06 DIAGNOSIS — Z5189 Encounter for other specified aftercare: Secondary | ICD-10-CM

## 2014-08-06 MED ORDER — SULFAMETHOXAZOLE-TRIMETHOPRIM 800-160 MG PO TABS: 1.0000 | ORAL_TABLET | Freq: Two times a day (BID) | ORAL | Status: DC

## 2014-08-06 NOTE — Progress Notes (Deleted)
Dailian Silvernio used as interpreter for this encounter.

## 2014-08-06 NOTE — Progress Notes (Signed)
Pt came in for a wound check.  She stated that pus was coming out.  Pt informed that the dressing was starting to come off and she just finished pulling the dressing off.  She stated that on the dressing was blood and "pus like stuff" and it smelled bad.  Looking at the patients incision there was no odor, no bleeding, some erythema.  Attempted to have provider to come look at wound to see but provider would not have been able to come to see patient for at least 30 minutes and patient stated that she did want to come back here because it is too many memories.  I informed pt that I understood how coming here can bring back bad memories and to let her know that we are here for her.  I cleansed wound with NS and gave patient mesh panties to wear so that her underwear with the elastic does not touch.  Spoke with Dr. Macon LargeAnyanwu received new orders to e-prescribe Bactrim DS po bid for 7 days then come in two to three days for follow up of wound check.  I encouraged pt to please come in on Friday so that she would have the antibiotics in her system for a couple days and that the provider will be able to evaluate her wound.  I also informed pt that it looks like she missed her appt on Monday 08/05/14 with Dr. Adrian BlackwaterStinson.  The pt told me that she missed her appt because she was bearing her daughter.  I informed pt that her appt scheduled on Friday is with Dr. Adrian BlackwaterStinson and he will address her coping mechanism and also check her wound.  Pt stated that she really did not want to come in.  I strongly emphasized the importance of coming to the appt especially since we need to monitor her wound to prevent infection.  I asked pt she if has already been speaking to someone to help her cope with the situations going on.  She informed me that she has spoken to [place], [place] and that she had referred her to a therapist.  Pt stated that she would come to the appt on Friday.  And pt did not have any further questions.

## 2014-08-07 NOTE — Telephone Encounter (Signed)
Rescheduled patient's appointment to 08/09/13 with Dr. Adrian BlackwaterStinson. Patient made aware while here in clinic on 08/06/14.

## 2014-08-09 ENCOUNTER — Telehealth: Payer: Self-pay

## 2014-08-09 ENCOUNTER — Ambulatory Visit: Payer: Medicaid Other | Admitting: Family Medicine

## 2014-08-09 NOTE — Telephone Encounter (Signed)
Called pt and informed her that she missed her appt and asked what happened?  Pt stated "I don't know I just can not get myself together".  I asked if she would be able to get here by 12 with understanding that it is short notice she stated "no".  I asked if she could on Monday pt stated "Well, I'm going to have to get my kids".  I asked pt when does she pick them up and pt stated "three o' clock".  I advised pt if she could come in at 1245 on Monday pt stated yes I can come in.  Pt stated that she felt ok and that her wound is not giving her any problems.  I confirmed with patient to make sure that she is taking the antibiotic pt stated that she was.  I stated if she has problems with her wound or she does not feel ok then she should go to MAU.  Pt agreed.

## 2014-08-12 ENCOUNTER — Ambulatory Visit: Payer: Medicaid Other | Admitting: Obstetrics & Gynecology

## 2014-08-15 ENCOUNTER — Encounter (HOSPITAL_COMMUNITY): Payer: Self-pay | Admitting: Obstetrics and Gynecology

## 2014-09-02 ENCOUNTER — Ambulatory Visit: Payer: Medicaid Other | Admitting: Family Medicine

## 2014-09-04 ENCOUNTER — Ambulatory Visit: Payer: Medicaid Other | Admitting: Family Medicine

## 2014-09-04 ENCOUNTER — Encounter: Payer: Self-pay | Admitting: *Deleted

## 2014-09-05 ENCOUNTER — Ambulatory Visit: Payer: Medicaid Other | Admitting: Family Medicine
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
## 2015-01-09 ENCOUNTER — Encounter (HOSPITAL_COMMUNITY): Payer: Self-pay | Admitting: Obstetrics and Gynecology

## 2015-10-14 ENCOUNTER — Encounter (HOSPITAL_COMMUNITY): Payer: Self-pay

## 2015-10-14 ENCOUNTER — Emergency Department (HOSPITAL_COMMUNITY)
Admission: EM | Admit: 2015-10-14 | Discharge: 2015-10-14 | Disposition: A | Discharge status: Home / Self Care | Payer: Medicaid Other | Source: Home / Self Care

## 2015-10-14 ENCOUNTER — Encounter (HOSPITAL_COMMUNITY): Payer: Self-pay | Admitting: Emergency Medicine

## 2015-10-14 ENCOUNTER — Emergency Department (HOSPITAL_COMMUNITY)
Admission: EM | Admit: 2015-10-14 | Discharge: 2015-10-14 | Disposition: A | Discharge status: Home / Self Care | Payer: Medicaid Other | Attending: Emergency Medicine | Admitting: Emergency Medicine

## 2015-10-14 DIAGNOSIS — F17210 Nicotine dependence, cigarettes, uncomplicated: Secondary | ICD-10-CM | POA: Insufficient documentation

## 2015-10-14 DIAGNOSIS — Z91040 Latex allergy status: Secondary | ICD-10-CM | POA: Diagnosis not present

## 2015-10-14 DIAGNOSIS — R1010 Upper abdominal pain, unspecified: Secondary | ICD-10-CM

## 2015-10-14 DIAGNOSIS — Z880 Allergy status to penicillin: Secondary | ICD-10-CM | POA: Insufficient documentation

## 2015-10-14 DIAGNOSIS — R197 Diarrhea, unspecified: Secondary | ICD-10-CM | POA: Insufficient documentation

## 2015-10-14 DIAGNOSIS — R112 Nausea with vomiting, unspecified: Secondary | ICD-10-CM | POA: Diagnosis not present

## 2015-10-14 DIAGNOSIS — R110 Nausea: Secondary | ICD-10-CM

## 2015-10-14 DIAGNOSIS — R1084 Generalized abdominal pain: Secondary | ICD-10-CM | POA: Insufficient documentation

## 2015-10-14 DIAGNOSIS — R109 Unspecified abdominal pain: Secondary | ICD-10-CM

## 2015-10-14 LAB — URINALYSIS, ROUTINE W REFLEX MICROSCOPIC
Bilirubin Urine: NEGATIVE
Glucose, UA: NEGATIVE mg/dL
Hgb urine dipstick: NEGATIVE
Ketones, ur: NEGATIVE mg/dL
LEUKOCYTES UA: NEGATIVE
Nitrite: NEGATIVE
PH: 7.5 (ref 5.0–8.0)
Protein, ur: NEGATIVE mg/dL
Specific Gravity, Urine: 1.017 (ref 1.005–1.030)

## 2015-10-14 LAB — COMPREHENSIVE METABOLIC PANEL
ALT: 15 U/L (ref 14–54)
AST: 18 U/L (ref 15–41)
Albumin: 3.7 g/dL (ref 3.5–5.0)
Alkaline Phosphatase: 36 U/L — ABNORMAL LOW (ref 38–126)
Anion gap: 9 (ref 5–15)
BUN: 7 mg/dL (ref 6–20)
CHLORIDE: 105 mmol/L (ref 101–111)
CO2: 25 mmol/L (ref 22–32)
Calcium: 9.8 mg/dL (ref 8.9–10.3)
Creatinine, Ser: 0.65 mg/dL (ref 0.44–1.00)
GFR calc Af Amer: >60 mL/min (ref >60)
Glucose, Bld: 99 mg/dL (ref 65–99)
POTASSIUM: 4.1 mmol/L (ref 3.5–5.1)
SODIUM: 139 mmol/L (ref 135–145)
Total Bilirubin: 0.2 mg/dL — ABNORMAL LOW (ref 0.3–1.2)
Total Protein: 6.4 g/dL — ABNORMAL LOW (ref 6.5–8.1)

## 2015-10-14 LAB — CBC
HEMATOCRIT: 39 % (ref 36.0–46.0)
Hemoglobin: 12.4 g/dL (ref 12.0–15.0)
MCH: 29.5 pg (ref 26.0–34.0)
MCHC: 31.8 g/dL (ref 30.0–36.0)
MCV: 92.9 fL (ref 78.0–100.0)
Platelets: 288 10*3/uL (ref 150–400)
RBC: 4.2 MIL/uL (ref 3.87–5.11)
RDW: 13.6 % (ref 11.5–15.5)
WBC: 7.2 10*3/uL (ref 4.0–10.5)

## 2015-10-14 LAB — LIPASE, BLOOD: Lipase: 47 U/L (ref 11–51)

## 2015-10-14 LAB — POC URINE PREG, ED: Preg Test, Ur: NEGATIVE

## 2015-10-14 MED ORDER — ONDANSETRON 4 MG PO TBDP
4.0000 mg | ORAL_TABLET | Freq: Once | ORAL | Status: AC
  Administered 2015-10-14: 4 mg via ORAL
  Filled 2015-10-14: qty 1

## 2015-10-14 MED ORDER — ONDANSETRON 4 MG PO TBDP: 4.0000 mg | ORAL_TABLET | Freq: Three times a day (TID) | ORAL | Status: DC | PRN

## 2015-10-14 MED ORDER — DICYCLOMINE HCL 20 MG PO TABS: 20.0000 mg | ORAL_TABLET | Freq: Two times a day (BID) | ORAL | Status: DC

## 2015-10-14 MED ORDER — HYDROCORTISONE 2.5 % RE CREA: TOPICAL_CREAM | RECTAL | Status: DC

## 2015-10-14 NOTE — Discharge Instructions (Signed)
You have been seen today for diarrhea, nausea, vomiting, and abdominal cramping. Follow up with PCP as needed if symptoms continue. Return to ED should symptoms worsen. Drink plenty of fluids and get plenty of rest. Zofran for nausea and vomiting. Bentyl for abdominal cramping.   RESOURCE GUIDE  Chronic Pain Problems: Contact [facility]  [phone] Patients need to be referred by their primary care doctor.  Insufficient Money for Medicine: Contact United Way:  call "211" or [facility] [phone]) [phone].  No Primary Care Doctor: - Call Health Connect  [phone] - can help you locate a primary care doctor that  accepts your insurance, provides certain services, etc. - Physician Referral Service- [phone]  Agencies that provide inexpensive medical care: - [facility]  [phone] - [facility]  [phone] - [facility]  [phone] - Women's Clinic  [phone] - Planned Parenthood  [phone] Haynes Bast- [facility]  [phone]  Medicaid-accepting [facility] [facility] Providers: - [facility]- [address], [address]  [phone], Mon-Fri 9am-7pm, Sat 9am-1pm - [facility]- [address], [address] [place]  [phone] - [facility]- [address], [address] [place]  [phone] [facility]- [address] [place] [address]  [phone] - Renaye RakersVeita Bland- [address], [address], [phone]  Only accepts [place] patients after they have their name  applied to their card  Self Pay (no insurance) in [place]: - Sickle Cell Patients: Dr Willey BladeEric Dean, [facility]  [address], [phone] - [facility]- [address]  [phone]       Redge Gainer-     [facility] [place]- [address], [address]       -     [facility]- see information above (Speak to Cit[employer]am H if you do not have insurance)       -   [facility]- [address], [phone]       -  [facility]- [address],  [phone]       -  [facility]- [address] [place] [address], [phone]       -  Dr Julio Sickssei-Bonsu-  [address], [address], [place], [phone]       -  [facility]- [address], [phone]       -  [facility] [place]- [address], [phone], also [address], [phone]       -    [facility]- [address], [phone], 1st & 3rd Saturday   every month, 10am-1pm  1) Find a Doctor and Pay Out of Pocket Although you won't have to find out who is covered by your insurance plan, it is a good idea to ask around and get recommendations. You will then need to call the office and see if the doctor you have chosen will accept you as a new patient and what types of options they offer for patients who are self-pay. Some doctors offer discounts or will set up payment plans for their patients who do not have insurance, but you will need to ask so you aren't surprised when you get to your appointment.  2) Contact Your Local Health Department Not all health departments have doctors that can see patients for sick visits, but many do, so  it is worth a call to see if yours does. If you don't know where your local health department is, you can check in your phone book. The CDC also has a tool to help you locate your state's health department, and many state websites also have listings of all of their local health departments.  3) Find a Walk-in Clinic If your illness is not likely to be very severe or complicated, you may want to try a walk in clinic. These are popping up all over the country in pharmacies, drugstores, and shopping centers. They're usually staffed by nurse practitioners or physician assistants that have been trained to treat common illnesses and complaints. They're usually fairly quick and inexpensive. However, if you have serious medical issues or  chronic medical problems, these are probably not your best option  STD Testing - [facility] of [facility], STD Clinic, [address] [place], phone [phone] or [phone].  Monday - Friday, call for an appointment. [facility] of [employer], STD Clinic, [place] [address], [place], phone [phone] or [phone].  Monday - Friday, call for an appointment.  Abuse/Neglect: [facility] Child Abuse Hotline 706-764-4404 [facility] Child Abuse Hotline (678) 551-0005 (After Hours)  Emergency Shelter:  [facility] (670)289-7261  Maternity Homes: - [facility] [place] [facility] [phone] - [facility] [phone]  MRSA Hotline #:   (702) 720-0740  [facility] Resources  [facility]  [facility] Dept. [address]                 [address]         [address] [place] [address]  Blondell Reveal Phone:  [phone]                                  Phone:  [phone]                   Phone:  7540044769  [facility] Mental Health, [phone] - [facility] - [phone]       -     [facility] in [place], [address]                                  [phone], [facility] Child Abuse Hotline 272 582 4778 or 289-296-9446 (After Hours)   Behavioral Health Services  Substance Abuse Resources: - [facility] Drug Services  (367)880-4598 - [facility] [phone] - [facility] [phone] Floydene Flock [phone] - Residential & Outpatient Substance Abuse Program  [phone])[phone]  Psychological Services: [facility]  [phone] Services  [phone] - [facility], [phone] [place]. [address] [place], ACCESS LINE: 709-849-3383 or [phone])[phone], [employer]ur[employer]an.co.za  Dental  Assistance  If unable to pay or uninsured, contact:  [facility] or [facility] to become qualified for the adult dental clinic.  Patients with Medicaid: [facility] [phone] [address], [phone] [address], [phone]  If unable to pay, or uninsured, contact [facility] (581) 346-0972) or [facility] Department [phone] in [place], [phone] in [facility]) to become qualified for the adult dental clinic   Other Low-Cost Community Dental Services: - [facility]- [address] [place], [place], [place], [address], [phone], [address]. [address], 2nd and 4th Thursday of the month at 6:30am.  10 clients each day by appointment, can sometimes see walk-in patients if someone does not show for an appointment. [facility]- [address] [place], [place], [address], [phone] - [facility]- [address] [place], [place], [address], [phone] - [facility]- [phone] [facility] Health Department- [phone] [facility] Department- [phone]

## 2015-10-14 NOTE — ED Provider Notes (Signed)
CSN: [id number]     Arrival date & time 10/14/15  0912 History   First MD Initiated Contact with Patient 10/14/15 1403     Chief Complaint  Patient presents with  . Abdominal Pain  . Diarrhea  . Nausea     (Consider location/radiation/quality/duration/timing/severity/associated sxs/prior Treatment) HPI  Cathy [name] is a 31 y.o. female, patient no pertinent past medical history, presenting to the ED with abdominal cramping, nausea, vomiting, and diarrhea for the past 3 days. Patient's cramping is generalized and moves across her abdomen. Patient has tried milk of magnesia and Apple cider vinegar to relieve symptoms with no relief. Patient states that she has been around people with similar symptoms recently. Patient denies fever/chills, vaginal discharge or bleeding, dysuria, or any other complaints.    Past Medical History  Diagnosis Date  . Vaginal Pap smear, abnormal    Past Surgical History  Procedure Laterality Date  . Cesarean section      x4  . Ovarian cyst removal    . Dilation and curettage of uterus    . Cesarean section N/A 07/29/2014    Procedure: CESAREAN SECTION;  Surgeon: Willodean Rosenthal, MD;  Location: [facility];  Service: Obstetrics;  Laterality: N/A;   History reviewed. No pertinent family history. Social History  Substance Use Topics  . Smoking status: Current Every Day Smoker -- 0.25 packs/day    Types: Cigarettes  . Smokeless tobacco: Never Used  . Alcohol Use: No   OB History    Gravida Para Term Preterm AB TAB SAB Ectopic Multiple Living   0     Review of Systems  Constitutional: Negative for fever, chills and diaphoresis.  Respiratory: Negative for shortness of breath.   Cardiovascular: Negative for chest pain.  Gastrointestinal: Positive for nausea, vomiting, abdominal pain and diarrhea. Negative for constipation and blood in stool.  Genitourinary: Negative for dysuria, hematuria, flank pain, vaginal bleeding, vaginal  discharge, vaginal pain and pelvic pain.  Skin: Negative for color change, pallor and rash.  Neurological: Negative for dizziness and light-headedness.  All other systems reviewed and are negative.     Allergies  Lactose intolerance (gi); Penicillins; and Latex  Home Medications   Prior to Admission medications   Medication Sig Start Date End Date Taking? Authorizing Provider  alum & mag hydroxide-simeth (MAALOX/MYLANTA) 200-200-20 MG/5ML suspension Take 30 mLs by mouth every 6 (six) hours as needed for indigestion or heartburn.   Yes Historical Provider, MD  APPLE CIDER VINEGAR PO Take 60 mLs by mouth daily as needed (stomach upset).   Yes Historical Provider, MD  calcium carbonate (TUMS - DOSED IN MG ELEMENTAL CALCIUM) 500 MG chewable tablet Chew 2 tablets by mouth 3 (three) times daily as needed (sour stomach).   Yes Historical Provider, MD  dicyclomine (BENTYL) 20 MG tablet Take 1 tablet (20 mg total) by mouth 2 (two) times daily. 10/14/15   Cresta Riden C Rasul Decola, PA-C  hydrocortisone (ANUSOL-HC) 2.5 % rectal cream Apply rectally 2 times daily 10/14/15   Klayton Monie C Gurneet Matarese, PA-C  ondansetron (ZOFRAN ODT) 4 MG disintegrating tablet Take 1 tablet (4 mg total) by mouth every 8 (eight) hours as needed for nausea or vomiting. 10/14/15   Raine Elsass C Ave Scharnhorst, PA-C  sulfamethoxazole-trimethoprim (BACTRIM DS,SEPTRA DS) 800-160 MG per tablet Take 1 tablet by mouth 2 (two) times daily. Patient not taking: Reported on 10/14/2015 08/06/14   Tereso Newcomer, MD   BP 129/64 mmHg  Pulse  42  Temp(Src) 98.7 F (37.1 C) (Oral)  Resp 19  SpO2 100%  LMP 09/22/2015 (Approximate) Physical Exam  Constitutional: She appears well-developed and well-nourished. No distress.  HENT:  Head: Normocephalic and atraumatic.  Eyes: Conjunctivae are normal. Pupils are equal, round, and reactive to light.  Neck: Neck supple.  Cardiovascular: Normal rate, regular rhythm, normal heart sounds and intact distal pulses.   Pulmonary/Chest: Effort  normal and breath sounds normal. No respiratory distress.  Abdominal: Soft. Bowel sounds are normal. There is no tenderness. There is no guarding and no CVA tenderness.  Patient's abdomen is completely nontender.  Musculoskeletal: She exhibits no edema or tenderness.  Lymphadenopathy:    She has no cervical adenopathy.  Neurological: She is alert.  Skin: Skin is warm and dry. She is not diaphoretic.  Psychiatric: She has a normal mood and affect. Her behavior is normal.  Nursing note and vitals reviewed.   ED Course  Procedures (including critical care time) Labs Review Labs Reviewed - No data to display  CBC    Component Value Date/Time   WBC 7.2 10/14/2015 0700   WBC 9.3 12/17/2013 1336   RBC 4.20 10/14/2015 0700   RBC 3.85 12/17/2013 1336   HGB 12.4 10/14/2015 0700   HGB 12.1 12/17/2013 1336   HCT 39.0 10/14/2015 0700   HCT 36.4 12/17/2013 1336   PLT 288 10/14/2015 0700   PLT 288 12/17/2013 1336   MCV 92.9 10/14/2015 0700   MCV 94 12/17/2013 1336   MCH 29.5 10/14/2015 0700   MCH 31.4 12/17/2013 1336   MCHC 31.8 10/14/2015 0700   MCHC 33.3 12/17/2013 1336   RDW 13.6 10/14/2015 0700   RDW 12.8 12/17/2013 1336   LYMPHSABS 2.7 12/17/2013 1336   MONOABS 0.8 12/17/2013 1336   EOSABS 0.1 12/17/2013 1336   BASOSABS 0.0 12/17/2013 1336    CMP     Component Value Date/Time   NA 139 10/14/2015 0700   NA 135* 12/17/2013 1336   K 4.1 10/14/2015 0700   K 3.8 12/17/2013 1336   CL 105 10/14/2015 0700   CL 104 12/17/2013 1336   CO2 25 10/14/2015 0700   CO2 27 12/17/2013 1336   GLUCOSE 99 10/14/2015 0700   GLUCOSE 90 12/17/2013 1336   BUN 7 10/14/2015 0700   BUN 8 12/17/2013 1336   CREATININE 0.65 10/14/2015 0700   CREATININE 0.58* 12/17/2013 1336   CALCIUM 9.8 10/14/2015 0700   CALCIUM 8.7 12/17/2013 1336   PROT 6.4* 10/14/2015 0700   PROT 7.3 12/17/2013 1336   ALBUMIN 3.7 10/14/2015 0700   ALBUMIN 3.5 12/17/2013 1336   AST 18 10/14/2015 0700   AST 11* 12/17/2013  1336   ALT 15 10/14/2015 0700   ALT 18 12/17/2013 1336   ALKPHOS 36* 10/14/2015 0700   ALKPHOS 38* 12/17/2013 1336   BILITOT 0.2* 10/14/2015 0700   BILITOT 0.3 12/17/2013 1336   GFRNONAA >60 10/14/2015 0700   GFRNONAA >60 12/17/2013 1336   GFRAA >60 10/14/2015 0700   GFRAA >60 12/17/2013 1336   Lipase     Component Value Date/Time   LIPASE 47 10/14/2015 0700       Imaging Review No results found. I have personally reviewed and evaluated these lab results as part of my medical decision-making.   EKG Interpretation None      MDM   Final diagnoses:  Non-intractable vomiting with nausea, vomiting of unspecified type  Diarrhea, unspecified type  Abdominal cramping    Cathy [name] presents  with abdominal cramping, nausea, vomiting, and diarrhea for the past 3 days.  Patient's presentation is consistent with viral GI infection. She has no concerning findings on her history or her physical exam. No findings that would give evidence for surgical abdomen. Patient's vital signs are all stable and within normal limits. Patient requested discharge as soon as I arrived in the room for my initial interview. Additionally, patient apparently checked in at [facility], had blood drawn, and left due to the long wait time. Patient states that she does not want any labs or imaging here. Patient adds that she has a history of hemorrhoids and she thinks that she has one now. Patient would not allow rectal exam. Home care and return precautions were discussed. Patient voiced understanding of these instructions and is comfortable with discharge.  Filed Vitals:   10/14/15 0926 10/14/15 1045  BP: 123/65 129/64  Pulse: 52 42  Temp: 98.3 F (36.8 C) 98.7 F (37.1 C)  TempSrc: Oral Oral  Resp: 20 19  SpO2: 99% 100%       Anselm PancoastShawn C Kenrick Pore, PA-C 10/14/15 1441  Loren Raceravid Yelverton, MD 10/16/15 2340

## 2015-10-14 NOTE — ED Notes (Signed)
Pt. reports mid/uppwer abdominal pain onset 2 days ago with multiple diarrhea and nausea , denies fever or chills.

## 2015-10-14 NOTE — ED Notes (Signed)
Patient was checked in at [facility] and had blood drawn already. Patient left because the wait was too long. Patient c/o generalized abdominal pain, nausea, and diarrhea. Patient has taken OTC meds with no relief.

## 2016-02-01 ENCOUNTER — Encounter (HOSPITAL_COMMUNITY): Payer: Self-pay | Admitting: *Deleted

## 2016-02-01 ENCOUNTER — Emergency Department (HOSPITAL_COMMUNITY)
Admission: EM | Admit: 2016-02-01 | Discharge: 2016-02-01 | Disposition: A | Discharge status: Home / Self Care | Payer: Medicaid Other | Attending: Emergency Medicine | Admitting: Emergency Medicine

## 2016-02-01 DIAGNOSIS — R112 Nausea with vomiting, unspecified: Secondary | ICD-10-CM

## 2016-02-01 DIAGNOSIS — F17210 Nicotine dependence, cigarettes, uncomplicated: Secondary | ICD-10-CM | POA: Diagnosis not present

## 2016-02-01 DIAGNOSIS — R5383 Other fatigue: Secondary | ICD-10-CM | POA: Diagnosis not present

## 2016-02-01 DIAGNOSIS — Z331 Pregnant state, incidental: Secondary | ICD-10-CM | POA: Insufficient documentation

## 2016-02-01 DIAGNOSIS — Z79899 Other long term (current) drug therapy: Secondary | ICD-10-CM | POA: Diagnosis not present

## 2016-02-01 LAB — COMPREHENSIVE METABOLIC PANEL
ALK PHOS: 35 U/L — AB (ref 38–126)
ALT: 14 U/L (ref 14–54)
ANION GAP: 4 — AB (ref 5–15)
AST: 14 U/L — ABNORMAL LOW (ref 15–41)
Albumin: 3.6 g/dL (ref 3.5–5.0)
BILIRUBIN TOTAL: 0.7 mg/dL (ref 0.3–1.2)
BUN: 7 mg/dL (ref 6–20)
CALCIUM: 8.7 mg/dL — AB (ref 8.9–10.3)
CO2: 23 mmol/L (ref 22–32)
CREATININE: 0.58 mg/dL (ref 0.44–1.00)
Chloride: 107 mmol/L (ref 101–111)
Glucose, Bld: 108 mg/dL — ABNORMAL HIGH (ref 65–99)
Potassium: 3.9 mmol/L (ref 3.5–5.1)
Sodium: 134 mmol/L — ABNORMAL LOW (ref 135–145)
TOTAL PROTEIN: 6.5 g/dL (ref 6.5–8.1)

## 2016-02-01 LAB — CBC WITH DIFFERENTIAL/PLATELET
Basophils Absolute: 0 10*3/uL (ref 0.0–0.1)
Basophils Relative: 0 %
EOS ABS: 0.1 10*3/uL (ref 0.0–0.7)
Eosinophils Relative: 2 %
HEMATOCRIT: 33.3 % — AB (ref 36.0–46.0)
HEMOGLOBIN: 11.3 g/dL — AB (ref 12.0–15.0)
LYMPHS ABS: 2.5 10*3/uL (ref 0.7–4.0)
LYMPHS PCT: 29 %
MCH: 31 pg (ref 26.0–34.0)
MCHC: 33.9 g/dL (ref 30.0–36.0)
MCV: 91.5 fL (ref 78.0–100.0)
MONOS PCT: 8 %
Monocytes Absolute: 0.8 10*3/uL (ref 0.1–1.0)
NEUTROS PCT: 61 %
Neutro Abs: 5.5 10*3/uL (ref 1.7–7.7)
Platelets: 310 10*3/uL (ref 150–400)
RBC: 3.64 MIL/uL — AB (ref 3.87–5.11)
RDW: 13 % (ref 11.5–15.5)
WBC: 8.9 10*3/uL (ref 4.0–10.5)

## 2016-02-01 LAB — I-STAT BETA HCG BLOOD, ED (MC, WL, AP ONLY)

## 2016-02-01 MED ORDER — SODIUM CHLORIDE 0.9 % IV BOLUS (SEPSIS)
1000.0000 mL | Freq: Once | INTRAVENOUS | Status: AC
  Administered 2016-02-01: 1000 mL via INTRAVENOUS

## 2016-02-01 MED ORDER — ONDANSETRON HCL 4 MG/2ML IJ SOLN
4.0000 mg | Freq: Once | INTRAMUSCULAR | Status: AC
  Administered 2016-02-01: 4 mg via INTRAVENOUS
  Filled 2016-02-01: qty 2

## 2016-02-01 MED ORDER — ONDANSETRON HCL 4 MG PO TABS: 4.0000 mg | ORAL_TABLET | Freq: Three times a day (TID) | ORAL | Status: DC | PRN

## 2016-02-01 MED ORDER — SODIUM CHLORIDE 0.9 % IV BOLUS (SEPSIS): 1000.0000 mL | Freq: Once | INTRAVENOUS | Status: DC

## 2016-02-01 NOTE — ED Notes (Signed)
Patient donated plasma one week ago for the first time and states she has not felt well since that time.  Patient states she has been fatigued and when she makes swift movements she becomes dizzy and occasionally has ringing in her ears.  Patient also states she has vomited several times over past week.  Patient denies fever and diarrhea.  Patient denies pain and SOB.

## 2016-02-01 NOTE — Discharge Instructions (Signed)
You have a positive pregnancy test today, and this is the cause of your vomiting and fatigue recently. Please establish OB doctor for follow-up care. You have been given options of above for OB follow-up. You should know that though these clinics are not associated with [facility], they often deliver babies at [facility] and use their facilities.   Return for worsening symptoms, including vaginal bleeding, severe abdominal pain, intractable vomiting despite nausea medications, or any other symptoms concerning to you.  First Trimester of Pregnancy The first trimester of pregnancy is from week 1 until the end of week 12 (months 1 through 3). A week after a sperm fertilizes an egg, the egg will implant on the wall of the uterus. This embryo will begin to develop into a baby. Genes from you and your partner are forming the baby. The female genes determine whether the baby is a boy or a girl. At 6-8 weeks, the eyes and face are formed, and the heartbeat can be seen on ultrasound. At the end of 12 weeks, all the baby's organs are formed.  Now that you are pregnant, you will want to do everything you can to have a healthy baby. Two of the most important things are to get good prenatal care and to follow your health care provider's instructions. Prenatal care is all the medical care you receive before the baby's birth. This care will help prevent, find, and treat any problems during the pregnancy and childbirth. BODY CHANGES Your body goes through many changes during pregnancy. The changes vary from woman to woman.   You may gain or lose a couple of pounds at first.  You may feel sick to your stomach (nauseous) and throw up (vomit). If the vomiting is uncontrollable, call your health care provider.  You may tire easily.  You may develop headaches that can be relieved by medicines approved by your health care provider.  You may urinate more often. Painful urination may mean you have a bladder  infection.  You may develop heartburn as a result of your pregnancy.  You may develop constipation because certain hormones are causing the muscles that push waste through your intestines to slow down.  You may develop hemorrhoids or swollen, bulging veins (varicose veins).  Your breasts may begin to grow larger and become tender. Your nipples may stick out more, and the tissue that surrounds them (areola) may become darker.  Your gums may bleed and may be sensitive to brushing and flossing.  Dark spots or blotches (chloasma, mask of pregnancy) may develop on your face. This will likely fade after the baby is born.  Your menstrual periods will stop.  You may have a loss of appetite.  You may develop cravings for certain kinds of food.  You may have changes in your emotions from day to day, such as being excited to be pregnant or being concerned that something may go wrong with the pregnancy and baby.  You may have more vivid and strange dreams.  You may have changes in your hair. These can include thickening of your hair, rapid growth, and changes in texture. Some women also have hair loss during or after pregnancy, or hair that feels dry or thin. Your hair will most likely return to normal after your baby is born. WHAT TO EXPECT AT YOUR PRENATAL VISITS During a routine prenatal visit:  You will be weighed to make sure you and the baby are growing normally.  Your blood pressure will be taken.  Your abdomen will be measured to track your baby's growth.  The fetal heartbeat will be listened to starting around week 10 or 12 of your pregnancy.  Test results from any previous visits will be discussed. Your health care provider may ask you:  How you are feeling.  If you are feeling the baby move.  If you have had any abnormal symptoms, such as leaking fluid, bleeding, severe headaches, or abdominal cramping.  If you are using any tobacco products, including cigarettes, chewing  tobacco, and electronic cigarettes.  If you have any questions. Other tests that may be performed during your first trimester include:  Blood tests to find your blood type and to check for the presence of any previous infections. They will also be used to check for low iron levels (anemia) and Rh antibodies. Later in the pregnancy, blood tests for diabetes will be done along with other tests if problems develop.  Urine tests to check for infections, diabetes, or protein in the urine.  An ultrasound to confirm the proper growth and development of the baby.  An amniocentesis to check for possible genetic problems.  Fetal screens for spina bifida and Down syndrome.  You may need other tests to make sure you and the baby are doing well.  HIV (human immunodeficiency virus) testing. Routine prenatal testing includes screening for HIV, unless you choose not to have this test. HOME CARE INSTRUCTIONS  Medicines  Follow your health care provider's instructions regarding medicine use. Specific medicines may be either safe or unsafe to take during pregnancy.  Take your prenatal vitamins as directed.  If you develop constipation, try taking a stool softener if your health care provider approves. Diet  Eat regular, well-balanced meals. Choose a variety of foods, such as meat or vegetable-based protein, fish, milk and low-fat dairy products, vegetables, fruits, and whole grain breads and cereals. Your health care provider will help you determine the amount of weight gain that is right for you.  Avoid raw meat and uncooked cheese. These carry germs that can cause birth defects in the baby.  Eating four or five small meals rather than three large meals a day may help relieve nausea and vomiting. If you start to feel nauseous, eating a few soda crackers can be helpful. Drinking liquids between meals instead of during meals also seems to help nausea and vomiting.  If you develop constipation, eat more  high-fiber foods, such as fresh vegetables or fruit and whole grains. Drink enough fluids to keep your urine clear or pale yellow. Activity and Exercise  Exercise only as directed by your health care provider. Exercising will help you:  Control your weight.  Stay in shape.  Be prepared for labor and delivery.  Experiencing pain or cramping in the lower abdomen or low back is a good sign that you should stop exercising. Check with your health care provider before continuing normal exercises.  Try to avoid standing for long periods of time. Move your legs often if you must stand in one place for a long time.  Avoid heavy lifting.  Wear low-heeled shoes, and practice good posture.  You may continue to have sex unless your health care provider directs you otherwise. Relief of Pain or Discomfort  Wear a good support bra for breast tenderness.   Take warm sitz baths to soothe any pain or discomfort caused by hemorrhoids. Use hemorrhoid cream if your health care provider approves.   Rest with your legs elevated if you have leg  cramps or low back pain.  If you develop varicose veins in your legs, wear support hose. Elevate your feet for 15 minutes, 3-4 times a day. Limit salt in your diet. Prenatal Care  Schedule your prenatal visits by the twelfth week of pregnancy. They are usually scheduled monthly at first, then more often in the last 2 months before delivery.  Write down your questions. Take them to your prenatal visits.  Keep all your prenatal visits as directed by your health care provider. Safety  Wear your seat belt at all times when driving.  Make a list of emergency phone numbers, including numbers for family, friends, the hospital, and police and fire departments. General Tips  Ask your health care provider for a referral to a local prenatal education class. Begin classes no later than at the beginning of month 6 of your pregnancy.  Ask for help if you have  counseling or nutritional needs during pregnancy. Your health care provider can offer advice or refer you to specialists for help with various needs.  Do not use hot tubs, steam rooms, or saunas.  Do not douche or use tampons or scented sanitary pads.  Do not cross your legs for long periods of time.  Avoid cat litter boxes and soil used by cats. These carry germs that can cause birth defects in the baby and possibly loss of the fetus by miscarriage or stillbirth.  Avoid all smoking, herbs, alcohol, and medicines not prescribed by your health care provider. Chemicals in these affect the formation and growth of the baby.  Do not use any tobacco products, including cigarettes, chewing tobacco, and electronic cigarettes. If you need help quitting, ask your health care provider. You may receive counseling support and other resources to help you quit.  Schedule a dentist appointment. At home, brush your teeth with a soft toothbrush and be gentle when you floss. SEEK MEDICAL CARE IF:   You have dizziness.  You have mild pelvic cramps, pelvic pressure, or nagging pain in the abdominal area.  You have persistent nausea, vomiting, or diarrhea.  You have a bad smelling vaginal discharge.  You have pain with urination.  You notice increased swelling in your face, hands, legs, or ankles. SEEK IMMEDIATE MEDICAL CARE IF:   You have a fever.  You are leaking fluid from your vagina.  You have spotting or bleeding from your vagina.  You have severe abdominal cramping or pain.  You have rapid weight gain or loss.  You vomit blood or material that looks like coffee grounds.  You are exposed to [place] measles and have never had them.  You are exposed to fifth disease or chickenpox.  You develop a severe headache.  You have shortness of breath.  You have any kind of trauma, such as from a fall or a car accident.   This information is not intended to replace advice given to you by your  health care provider. Make sure you discuss any questions you have with your health care provider.   Document Released: 07/13/2001 Document Revised: 08/09/2014 Document Reviewed: 05/29/2013 Elsevier Interactive Patient Education [employer]

## 2016-02-01 NOTE — ED Provider Notes (Signed)
CSN: [id number]     Arrival date & time 02/01/16  1414 History   First MD Initiated Contact with Patient 02/01/16 1425     Chief Complaint  Patient presents with  . Fatigue     (Consider location/radiation/quality/duration/timing/severity/associated sxs/prior Treatment) HPI  30 year old female who presents with fatigue for one week. Symptoms started after she donated plasma. She was told that they obtained about 800 mL from her. States feeling generalized weakness with fatigue that has been waxing and waning throughout this week. Is been associated with nonbloody nonbilious emesis. No diarrhea last bowel movement was 2 days ago but she has not been able to eat or drink much. Has had one headache during this time, but denies any recurrent headaches. Denies any heavy vaginal bleeding, abdominal pain, fevers, chills, urinary complaints, chest pain, difficulty breathing, cough. No known sick contacts. Last menstrual period was in May 2017.  Past Medical History  Diagnosis Date  . Vaginal Pap smear, abnormal    Past Surgical History  Procedure Laterality Date  . Cesarean section      x4  . Ovarian cyst removal    . Dilation and curettage of uterus    . Cesarean section N/A 07/29/2014    Procedure: CESAREAN SECTION;  Surgeon: Willodean Rosenthalarolyn Harraway-Smith, MD;  Location: [facility];  Service: Obstetrics;  Laterality: N/A;   History reviewed. No pertinent family history. Social History  Substance Use Topics  . Smoking status: Current Every Day Smoker -- 0.25 packs/day    Types: Cigarettes  . Smokeless tobacco: Never Used  . Alcohol Use: No   OB History    Gravida Para Term Preterm AB TAB SAB Ectopic Multiple Living   6 5 3 2 1  1  1  0     Review of Systems 10/14 systems reviewed and are negative other than those stated in the HPI   Allergies  Lactose intolerance (gi); Penicillins; and Latex  Home Medications   Prior to Admission medications   Medication Sig Start Date End Date Taking?  Authorizing Provider  APPLE CIDER VINEGAR PO Take 60 mLs by mouth daily as needed (stomach upset).   Yes Historical Provider, MD  calcium carbonate (TUMS - DOSED IN MG ELEMENTAL CALCIUM) 500 MG chewable tablet Chew 2 tablets by mouth 3 (three) times daily as needed (sour stomach).   Yes Historical Provider, MD  dicyclomine (BENTYL) 20 MG tablet Take 1 tablet (20 mg total) by mouth 2 (two) times daily. Patient not taking: Reported on 02/01/2016 10/14/15   Hillard DankerShawn C Joy, PA-C  hydrocortisone (ANUSOL-HC) 2.5 % rectal cream Apply rectally 2 times daily Patient not taking: Reported on 02/01/2016 10/14/15   Shawn C Joy, PA-C  ondansetron (ZOFRAN ODT) 4 MG disintegrating tablet Take 1 tablet (4 mg total) by mouth every 8 (eight) hours as needed for nausea or vomiting. Patient not taking: Reported on 02/01/2016 10/14/15   Shawn C Joy, PA-C  ondansetron (ZOFRAN) 4 MG tablet Take 1 tablet (4 mg total) by mouth every 8 (eight) hours as needed for nausea or vomiting. 02/01/16   Lavera Guiseana Duo Karol Liendo, MD   BP 117/61 mmHg  Pulse 81  Temp(Src) 97.9 F (36.6 C) (Oral)  Resp 19  SpO2 100%  LMP 12/14/2015 Physical Exam Physical Exam  Nursing note and vitals reviewed. Constitutional: Well developed, well nourished, non-toxic, and in no acute distress Head: Normocephalic and atraumatic.  Mouth/Throat: Oropharynx is clear and dry lips, mucous membranes moist.  Neck: Normal range of motion. Neck  supple.  Cardiovascular: Normal rate and regular rhythm.   Pulmonary/Chest: Effort normal and breath sounds normal.  Abdominal: Soft. There is no tenderness. There is no rebound and no guarding.  Musculoskeletal: Normal range of motion.  Neurological: Alert, no facial droop, fluent speech, moves all extremities symmetrically Skin: Skin is warm and dry.  Psychiatric: Cooperative  ED Course  Procedures (including critical care time) Labs Review Labs Reviewed  CBC WITH DIFFERENTIAL/PLATELET - Abnormal; Notable for the following:     RBC 3.64 (*)    Hemoglobin 11.3 (*)    HCT 33.3 (*)    All other components within normal limits  COMPREHENSIVE METABOLIC PANEL - Abnormal; Notable for the following:    Sodium 134 (*)    Glucose, Bld 108 (*)    Calcium 8.7 (*)    AST 14 (*)    Alkaline Phosphatase 35 (*)    Anion gap 4 (*)    All other components within normal limits  I-STAT BETA HCG BLOOD, ED ([facility], [facility], [facility] ONLY) - Abnormal; Notable for the following:    I-stat hCG, quantitative >2000.0 (*)    All other components within normal limits    Imaging Review No results found. I have personally reviewed and evaluated these images and lab results as part of my medical decision-making.   EKG Interpretation   Date/Time:  Sunday February 01 2016 14:46:02 EDT Ventricular Rate:  67 PR Interval:    QRS Duration: 101 QT Interval:  384 QTC Calculation: 406 R Axis:   64 Text Interpretation:  Sinus rhythm Prolonged PR interval Low voltage,  precordial leads first degree av block. Otherwise, no significant change  from prior.  Confirmed by Lear Carstens MD, Annabelle HarmanANA [phone]([id number]) on 02/01/2016 2:49:07 PM      MDM   Final diagnoses:  Non-intractable vomiting with nausea, vomiting of unspecified type  Incidental pregnancy    Presenting with 1 week of fatigue, nausea and vomiting after donating plasma. Has stable vital signs, is well-appearing and in no acute distress. No active vomiting in the knee. She has incidental pregnancy which is more likely the etiology of her symptoms. No abdominal pain or vaginal bleeding and at this time does not require evaluation for ectopic pregnancy. Mild anemia 11. No major electrolyte or metabolic derangements. Improved after antiemetics and IVF. Discussed OB follow-up which she will establish. Strict return and follow-up instructions reviewed. She expressed understanding of all discharge instructions and felt comfortable with the plan of care.     Lavera Guiseana Duo Jailani Hogans, MD 02/01/16 [phone]

## 2016-03-11 IMAGING — US US OB LIMITED
1 series · 14 of 21 positions shown · non-contrast
Comparison: none

[Series 1: us ob limited · 21 acquisitions, 14 frames shown]
[im 1/21]
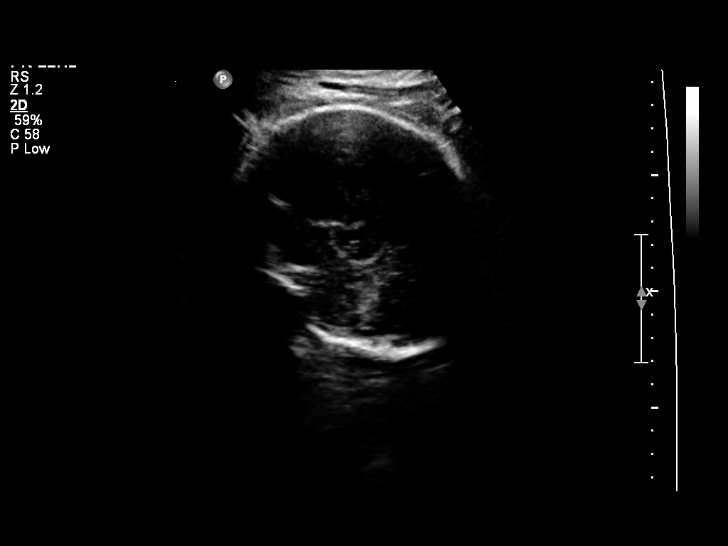
[im 3/21]
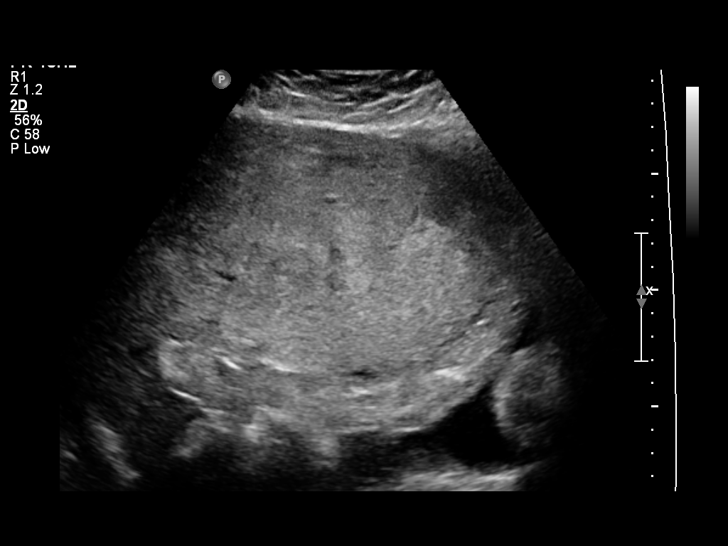
[im 4/21]
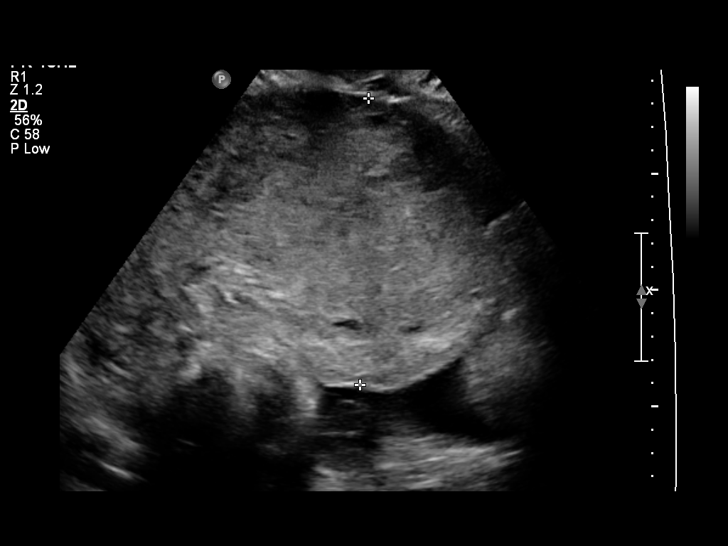
[im 6/21]
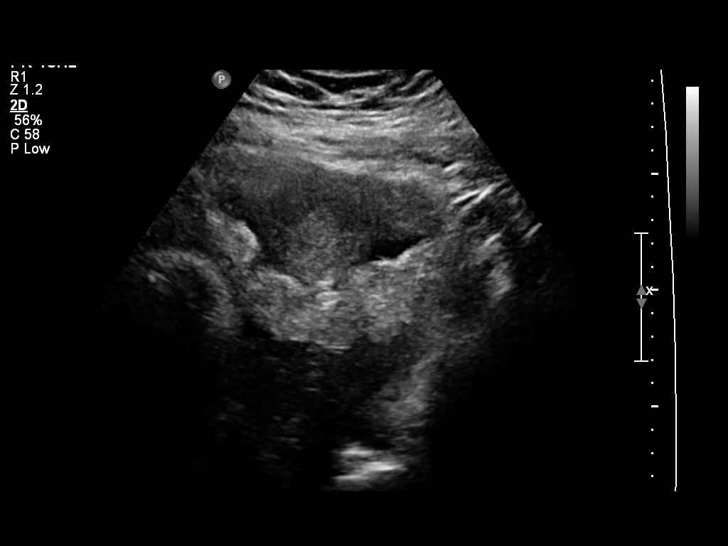
[im 7/21]
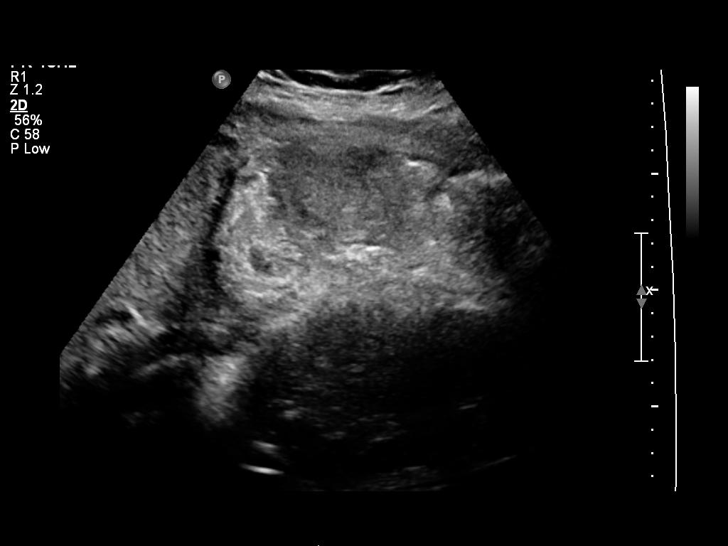
[im 9/21]
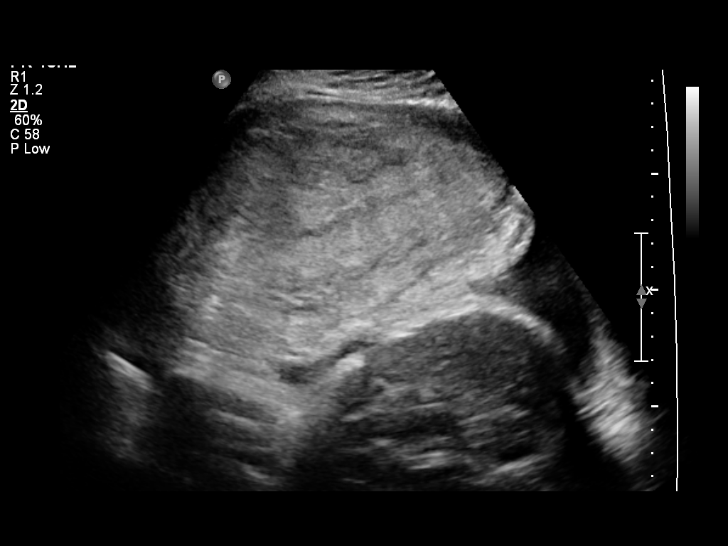
[im 10/21]
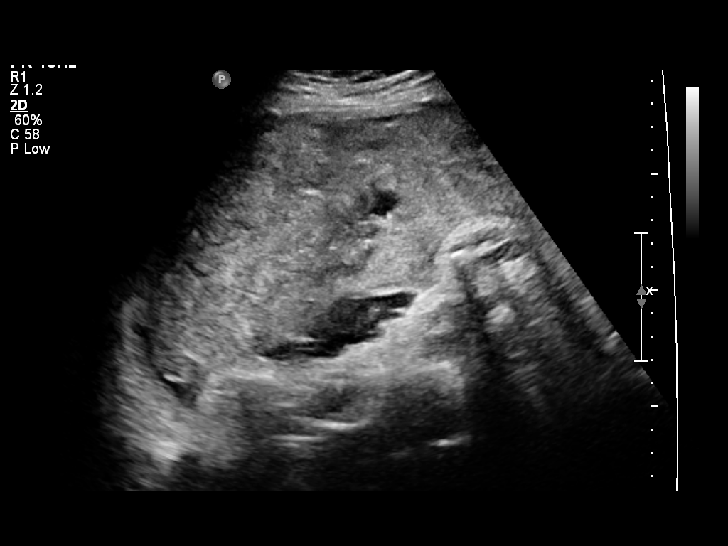
[im 12/21]
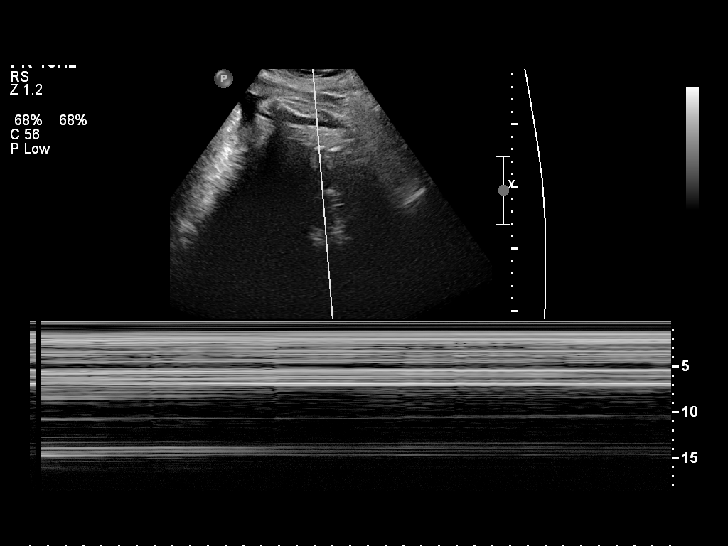
[im 13/21]
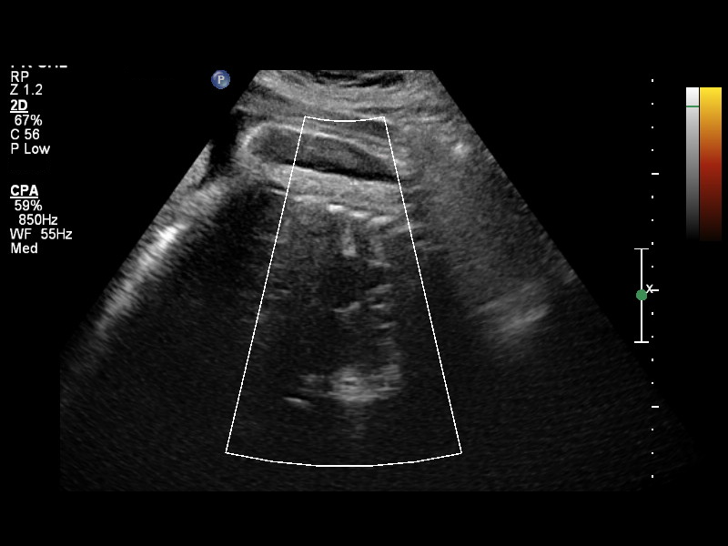
[im 15/21]
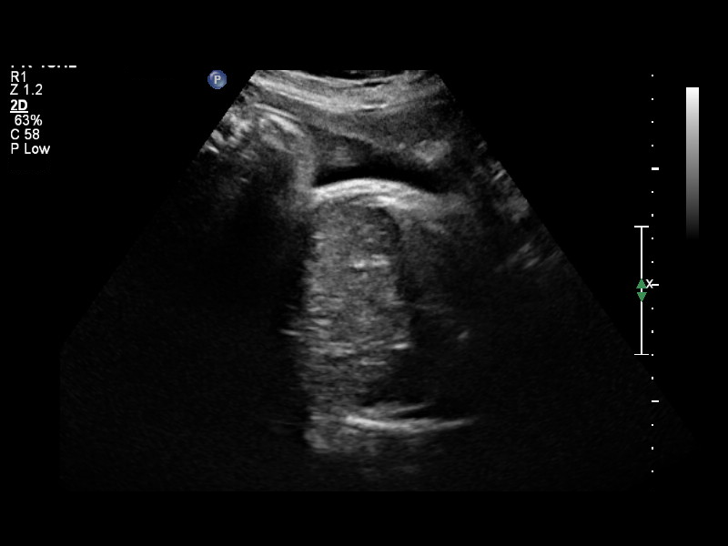
[im 16/21]
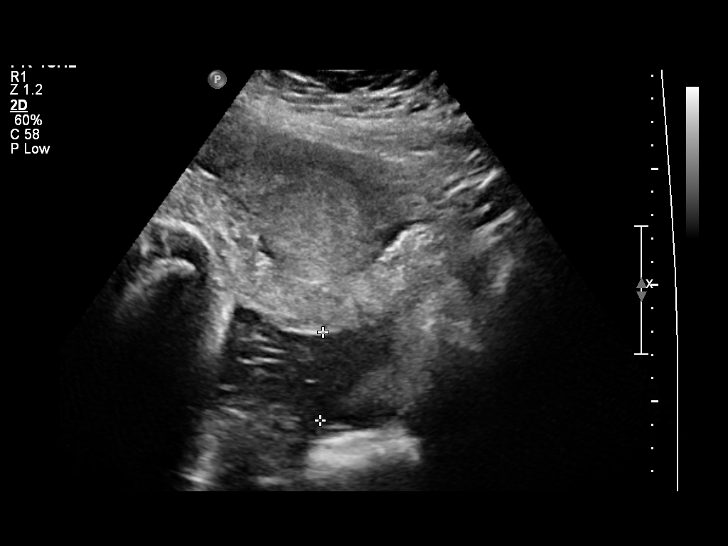
[im 18/21]
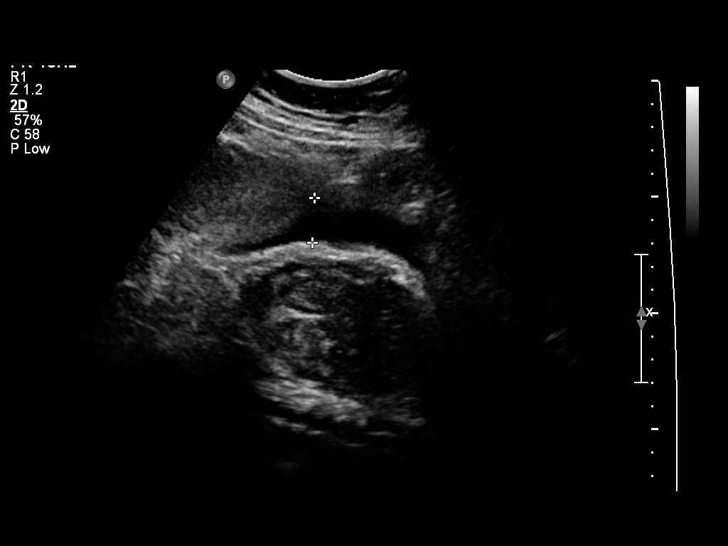
[im 19/21]
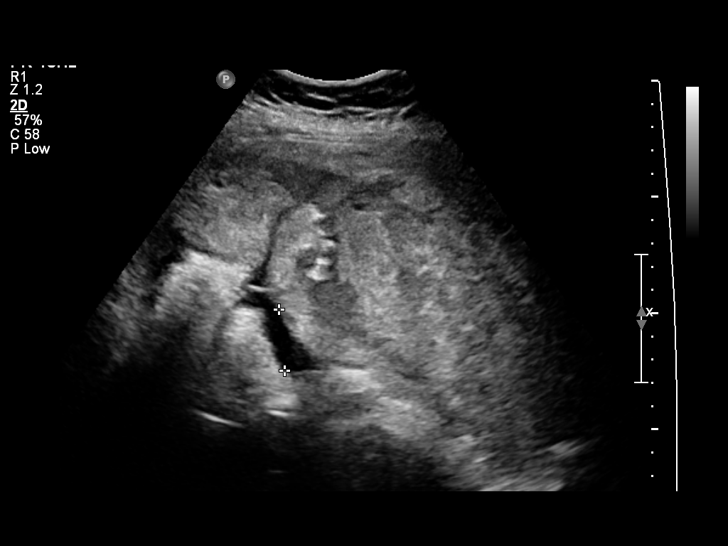
[im 21/21]
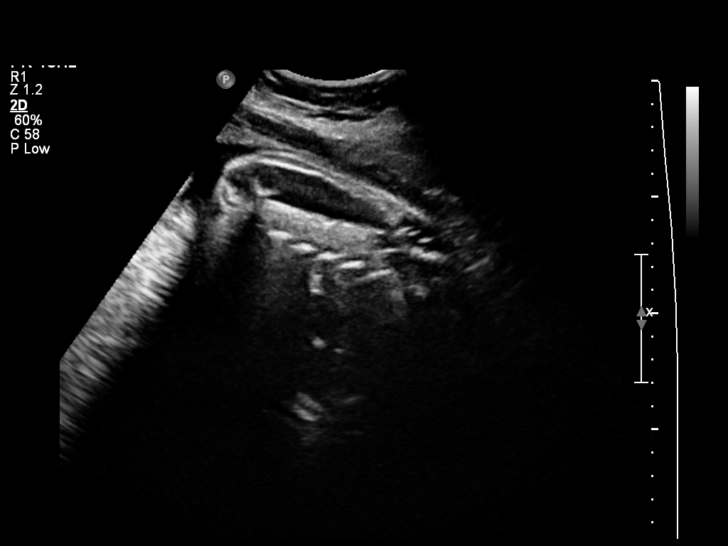

[14 of 21 positions shown; findings below may reference images not displayed]

OBSTETRICS REPORT
                      (Signed Final 07/30/2014 [DATE])

Service(s) Provided

 [HOSPITAL]                                         76815.0
Indications

 Abdominal pain in pregnancy
 Previous cesarean section x 4
 39 weeks gestation of pregnancy
 Pregnancy with inconclusive fetal viability
Fetal Evaluation

 Num Of Fetuses:    1
 Cardiac Activity:  Absent
 Presentation:      Cephalic
 Placenta:          Anterior, above cervical os

 Amniotic Fluid
 AFI FV:      Subjectively low-normal
 AFI Sum:     11       cm      37  %Tile
Gestational Age

 Clinical EDD:  39w 2d                                        EDD:    08/03/14
 Best:          39w 2d     Det. By:  Clinical EDD             EDD:    08/03/14
Cervix Uterus Adnexa

 Cervix:       Not visualized (advanced GA >73wks)

 Left Ovary:    Not visualized.
 Right Ovary:   Not visualized.
Impression

 Fetal demise at 39+2 weeks
 Vary large retroplacental, subchorionic hemorrhage c/w
 placental abruption
 Fetus appeared to be within the uterus
Recommendations

 Follow-up ultrasounds as clinically indicated.

## 2016-05-19 ENCOUNTER — Encounter: Payer: Self-pay | Admitting: Emergency Medicine

## 2016-07-15 LAB — OB RESULTS CONSOLE GC/CHLAMYDIA
Chlamydia: NEGATIVE
Gonorrhea: NEGATIVE

## 2016-07-15 LAB — OB RESULTS CONSOLE HIV ANTIBODY (ROUTINE TESTING): HIV: NONREACTIVE

## 2016-07-15 LAB — OB RESULTS CONSOLE ABO/RH: RH TYPE: POSITIVE

## 2016-07-15 LAB — OB RESULTS CONSOLE HEPATITIS B SURFACE ANTIGEN: HEP B S AG: NEGATIVE

## 2016-07-15 LAB — CYTOLOGY - PAP
Cystic Fibrosis Profile: NEGATIVE
Glucose 1 Hr Prenatal, POC: 124 mg/dL
Pap: NEGATIVE

## 2016-07-15 LAB — OB RESULTS CONSOLE HGB/HCT, BLOOD
HCT: 33 %
HEMOGLOBIN: 10.5 g/dL

## 2016-07-15 LAB — OB RESULTS CONSOLE RPR: RPR: NONREACTIVE

## 2016-07-15 LAB — OB RESULTS CONSOLE VARICELLA ZOSTER ANTIBODY, IGG: Varicella: IMMUNE

## 2016-07-15 LAB — OB RESULTS CONSOLE ANTIBODY SCREEN: ANTIBODY SCREEN: NEGATIVE

## 2016-07-15 LAB — OB RESULTS CONSOLE RUBELLA ANTIBODY, IGM: Rubella: IMMUNE

## 2016-07-15 LAB — OB RESULTS CONSOLE PLATELET COUNT: Platelets: 403 10*3/uL

## 2016-08-03 ENCOUNTER — Encounter: Payer: Self-pay | Admitting: *Deleted

## 2016-08-04 ENCOUNTER — Encounter: Payer: Self-pay | Admitting: Obstetrics and Gynecology

## 2016-08-04 ENCOUNTER — Ambulatory Visit (INDEPENDENT_AMBULATORY_CARE_PROVIDER_SITE_OTHER): Payer: Medicaid Other | Admitting: Obstetrics and Gynecology

## 2016-08-04 VITALS — BP 108/55 | HR 83 | Wt 237.8 lb

## 2016-08-04 DIAGNOSIS — O09293 Supervision of pregnancy with other poor reproductive or obstetric history, third trimester: Secondary | ICD-10-CM

## 2016-08-04 DIAGNOSIS — O09299 Supervision of pregnancy with other poor reproductive or obstetric history, unspecified trimester: Secondary | ICD-10-CM | POA: Insufficient documentation

## 2016-08-04 DIAGNOSIS — O0930 Supervision of pregnancy with insufficient antenatal care, unspecified trimester: Secondary | ICD-10-CM | POA: Insufficient documentation

## 2016-08-04 DIAGNOSIS — O0933 Supervision of pregnancy with insufficient antenatal care, third trimester: Secondary | ICD-10-CM

## 2016-08-04 DIAGNOSIS — O0990 Supervision of high risk pregnancy, unspecified, unspecified trimester: Secondary | ICD-10-CM

## 2016-08-04 DIAGNOSIS — Z8279 Family history of other congenital malformations, deformations and chromosomal abnormalities: Secondary | ICD-10-CM

## 2016-08-04 DIAGNOSIS — O34219 Maternal care for unspecified type scar from previous cesarean delivery: Secondary | ICD-10-CM

## 2016-08-04 LAB — POCT URINALYSIS DIP (DEVICE)
Glucose, UA: NEGATIVE mg/dL
HGB URINE DIPSTICK: NEGATIVE
NITRITE: NEGATIVE
PH: 5.5 (ref 5.0–8.0)
PROTEIN: 30 mg/dL — AB
Specific Gravity, Urine: 1.025 (ref 1.005–1.030)
UROBILINOGEN UA: 0.2 mg/dL (ref 0.0–1.0)

## 2016-08-04 NOTE — Patient Instructions (Signed)
Heartburn Introduction Heartburn is a type of pain or discomfort that can happen in the throat or chest. It is often described as a burning pain. It may also cause a bad taste in the mouth. Heartburn may feel worse when you lie down or bend over. It may be caused by stomach contents that move back up (reflux) into the tube that connects the mouth with the stomach (esophagus). Follow these instructions at home: Take these actions to lessen your discomfort and to help avoid problems. Diet  Follow a diet as told by your doctor. You may need to avoid foods and drinks such as:  Coffee and tea (with or without caffeine).  Drinks that contain alcohol.  Energy drinks and sports drinks.  Carbonated drinks or sodas.  Chocolate and cocoa.  Peppermint and mint flavorings.  Garlic and onions.  Horseradish.  Spicy and acidic foods, such as peppers, chili powder, curry powder, vinegar, hot sauces, and BBQ sauce.  Citrus fruit juices and citrus fruits, such as oranges, lemons, and limes.  Tomato-based foods, such as red sauce, chili, salsa, and pizza with red sauce.  Fried and fatty foods, such as donuts, french fries, potato chips, and high-fat dressings.  High-fat meats, such as hot dogs, rib eye steak, sausage, ham, and bacon.  High-fat dairy items, such as whole milk, butter, and cream cheese.  Eat small meals often. Avoid eating large meals.  Avoid drinking large amounts of liquid with your meals.  Avoid eating meals during the 2-3 hours before bedtime.  Avoid lying down right after you eat.  Do not exercise right after you eat. General instructions  Pay attention to any changes in your symptoms.  Take over-the-counter and prescription medicines only as told by your doctor. Do not take aspirin, ibuprofen, or other NSAIDs unless your doctor says it is okay.  Do not use any tobacco products, including cigarettes, chewing tobacco, and e-cigarettes. If you need help quitting, ask  your doctor.  Wear loose clothes. Do not wear anything tight around your waist.  Raise (elevate) the head of your bed about 6 inches (15 cm).  Try to lower your stress. If you need help doing this, ask your doctor.  If you are overweight, lose an amount of weight that is healthy for you. Ask your doctor about a safe weight loss goal.  Keep all follow-up visits as told by your doctor. This is important. Contact a doctor if:  You have new symptoms.  You lose weight and you do not know why it is happening.  You have trouble swallowing, or it hurts to swallow.  You have wheezing or a cough that keeps happening.  Your symptoms do not get better with treatment.  You have heartburn often for more than two weeks. Get help right away if:  You have pain in your arms, neck, jaw, teeth, or back.  You feel sweaty, dizzy, or light-headed.  You have chest pain or shortness of breath.  You throw up (vomit) and your throw up looks like blood or coffee grounds.  Your poop (stool) is bloody or black. This information is not intended to replace advice given to you by your health care provider. Make sure you discuss any questions you have with your health care provider. Document Released: 03/31/2011 Document Revised: 12/25/2015 Document Reviewed: 11/13/2014  2017 Elsevier  

## 2016-08-04 NOTE — Progress Notes (Signed)
Subjective:  Cathy [name] is a 31 y.o. [phone] at 4720w3d being seen today for ongoing prenatal care. [facility] H/O placenta abruption with FDIU during last pregnancy. No H/O HTN, UDS nicotine only. Prior c section x 4 She is currently monitored for the following issues for this high-risk pregnancy and has Previous cesarean delivery x 4, antepartum; First child has Downs syndrome; Abdominal pain in pregnancy, antepartum; Supervision of high-risk pregnancy; History of intrauterine fetal death, currently pregnant; and Late prenatal care on her problem list.  Patient reports heartburn and diarrhea.  Contractions: Not present. Vag. Bleeding: None.  Movement: Present. Denies leaking of fluid.   The following portions of the patient's history were reviewed and updated as appropriate: allergies, current medications, past family history, past medical history, past social history, past surgical history and problem list. Problem list updated.  Objective:   Vitals:   08/04/16 1311  BP: (!) 108/55  Pulse: 83  Weight: 237 lb 12.8 oz (107.9 kg)    Fetal Status:     Movement: Present     General:  Alert, oriented and cooperative. Patient is in no acute distress.  Skin: Skin is warm and dry. No rash noted.   Cardiovascular: Normal heart rate noted  Respiratory: Normal respiratory effort, no problems with respiration noted  Abdomen: Soft, gravid, appropriate for gestational age. Pain/Pressure: Present     Pelvic:  Cervical exam deferred        Extremities: Normal range of motion.  Edema: Trace  Mental Status: Normal mood and affect. Normal behavior. Normal judgment and thought content.   Urinalysis: Urine Protein: 1+ Urine Glucose: Negative  Assessment and Plan:  Pregnancy: [id number] at 2220w3d  1. Supervision of high risk pregnancy, antepartum Tx for indigestion and diarrhea reviewed - POCT urinalysis dip (device) - Prenatal Multivit-Min-Fe-FA  (PRENATAL VITAMINS PO); Take 1 tablet by mouth daily. - OB RESULTS CONSOLE GC/Chlamydia - OB RESULTS CONSOLE RPR - OB RESULTS CONSOLE HIV antibody - OB RESULTS CONSOLE Rubella Antibody - OB RESULTS CONSOLE Varicella zoster antibody, IgG - OB RESULTS CONSOLE Hepatitis B surface antigen - OB RESULTS CONSOLE ABO/Rh - OB RESULTS CONSOLE Antibody Screen - OB RESULTS CONSOLE Hemoglobin and hematocrit, blood - OB RESULTS CONSOLE PLATELET COUNT - Cytology - PAP Plans to breast feeding and considering IUD for contraception 2. Late prenatal care   3. Previous cesarean delivery x 4, antepartum Repeat at 39 weeks  4. First child has Downs syndrome To late for genetic testing  5. History of intrauterine fetal death, currently pregnant Placenta abruption Will start twice weekly testing  Preterm labor symptoms and general obstetric precautions including but not limited to vaginal bleeding, contractions, leaking of fluid and fetal movement were reviewed in detail with the patient. Please refer to After Visit Summary for other counseling recommendations.  Return in about 1 week (around 08/11/2016) for OB visit.   Hermina StaggersMichael L Jaileen Janelle, MD

## 2016-08-04 NOTE — Progress Notes (Signed)
Here for first prenatal visit- [facility]. Given new patient booklets. C/o diarrhea since yesterday. C/o heartburn

## 2016-08-10 ENCOUNTER — Ambulatory Visit: Payer: Self-pay

## 2016-08-10 ENCOUNTER — Encounter: Payer: Self-pay | Admitting: Advanced Practice Midwife

## 2016-08-10 ENCOUNTER — Ambulatory Visit (INDEPENDENT_AMBULATORY_CARE_PROVIDER_SITE_OTHER): Payer: Medicaid Other | Admitting: Advanced Practice Midwife

## 2016-08-10 VITALS — BP 108/56 | HR 82 | Wt 240.3 lb

## 2016-08-10 DIAGNOSIS — O09299 Supervision of pregnancy with other poor reproductive or obstetric history, unspecified trimester: Secondary | ICD-10-CM

## 2016-08-10 DIAGNOSIS — O0990 Supervision of high risk pregnancy, unspecified, unspecified trimester: Secondary | ICD-10-CM

## 2016-08-10 DIAGNOSIS — O09293 Supervision of pregnancy with other poor reproductive or obstetric history, third trimester: Secondary | ICD-10-CM | POA: Diagnosis not present

## 2016-08-10 DIAGNOSIS — Z8279 Family history of other congenital malformations, deformations and chromosomal abnormalities: Secondary | ICD-10-CM | POA: Diagnosis not present

## 2016-08-10 DIAGNOSIS — Z3689 Encounter for other specified antenatal screening: Secondary | ICD-10-CM

## 2016-08-10 NOTE — Progress Notes (Signed)
   PRENATAL VISIT NOTE  Subjective:  Cathy [name] is a 31 y.o. [id number] at 2965w3d being seen today for ongoing prenatal care.  She is currently monitored for the following issues for this high-risk pregnancy and has Previous cesarean delivery x 4, antepartum; First child has Downs syndrome; Abdominal pain in pregnancy, antepartum; Supervision of high-risk pregnancy; History of intrauterine fetal death, currently pregnant; and Late prenatal care on her problem list.  Patient reports no complaints.  Contractions: Not present. Vag. Bleeding: None.  Movement: Present. Denies leaking of fluid.   The following portions of the patient's history were reviewed and updated as appropriate: allergies, current medications, past family history, past medical history, past social history, past surgical history and problem list. Problem list updated.  Objective:   Vitals:   08/10/16 0905  BP: (!) 108/56  Pulse: 82  Weight: 240 lb 4.8 oz (109 kg)    Fetal Status: Fetal Heart Rate (bpm): NST Fundal Height: 34 cm Movement: Present  Presentation: Vertex  General:  Alert, oriented and cooperative. Patient is in no acute distress.  Skin: Skin is warm and dry. No rash noted.   Cardiovascular: Normal heart rate noted  Respiratory: Normal respiratory effort, no problems with respiration noted  Abdomen: Soft, gravid, appropriate for gestational age. Pain/Pressure: Present     Pelvic:  Cervical exam deferred        Extremities: Normal range of motion.     Mental Status: Normal mood and affect. Normal behavior. Normal judgment and thought content.   Assessment and Plan:  Pregnancy: [id number] at 2965w3d  1. History of intrauterine fetal death, currently pregnant --Pt to continue antenatal testing in our office.  Referral to MFM for US/genetic counseling. See note below r/t hx Down Syndrome. - Fetal nonstress test - US MFM OB COMP + 14 WK; Future - US OB Limited - AMB referral to maternal fetal medicine - US  MFM FETAL BPP WO NON STRESS; Future  2. Supervision of high risk pregnancy, antepartum   3. First child has Down syndrome --This child was put up for adoption.  Pt desires genetic testing but was late to care.  She had [facility] at [facility] prior to transfer to our office for [facility] care.  Offered visit with MFM for anatomy US and genetic counseling with [facility] if desired. Pt does desire this and appointments made.    Term labor symptoms and general obstetric precautions including but not limited to vaginal bleeding, contractions, leaking of fluid and fetal movement were reviewed in detail with the patient. Please refer to After Visit Summary for other counseling recommendations.  Return in about 6 days (around 08/16/2016) for Ob fu and NST/AFI.   Hurshel PartyLisa A Leftwich-Kirby, CNM

## 2016-08-13 ENCOUNTER — Ambulatory Visit (INDEPENDENT_AMBULATORY_CARE_PROVIDER_SITE_OTHER): Payer: Medicaid Other | Admitting: Obstetrics & Gynecology

## 2016-08-13 VITALS — BP 109/54 | HR 89

## 2016-08-13 DIAGNOSIS — O09293 Supervision of pregnancy with other poor reproductive or obstetric history, third trimester: Secondary | ICD-10-CM

## 2016-08-13 DIAGNOSIS — O09299 Supervision of pregnancy with other poor reproductive or obstetric history, unspecified trimester: Secondary | ICD-10-CM

## 2016-08-16 ENCOUNTER — Ambulatory Visit (INDEPENDENT_AMBULATORY_CARE_PROVIDER_SITE_OTHER): Payer: Medicaid Other | Admitting: Family Medicine

## 2016-08-16 VITALS — BP 115/57 | HR 78 | Wt 241.3 lb

## 2016-08-16 DIAGNOSIS — O34219 Maternal care for unspecified type scar from previous cesarean delivery: Secondary | ICD-10-CM

## 2016-08-16 DIAGNOSIS — O09293 Supervision of pregnancy with other poor reproductive or obstetric history, third trimester: Secondary | ICD-10-CM | POA: Diagnosis not present

## 2016-08-16 DIAGNOSIS — O0990 Supervision of high risk pregnancy, unspecified, unspecified trimester: Secondary | ICD-10-CM

## 2016-08-16 DIAGNOSIS — O09299 Supervision of pregnancy with other poor reproductive or obstetric history, unspecified trimester: Secondary | ICD-10-CM

## 2016-08-16 NOTE — Progress Notes (Signed)
   PRENATAL VISIT NOTE  Subjective:  Cathy [name] is a 31 y.o. [id number] at 8427w2d being seen today for ongoing prenatal care.  She is currently monitored for the following issues for this high-risk pregnancy and has Previous cesarean delivery x 5, antepartum; First child has Downs syndrome; Supervision of high-risk pregnancy; History of intrauterine fetal death, currently pregnant; and Late prenatal care on her problem list.  Patient reports pelvic pressure.  Contractions: Not present. Vag. Bleeding: None.  Movement: Present. Denies leaking of fluid.   The following portions of the patient's history were reviewed and updated as appropriate: allergies, current medications, past family history, past medical history, past social history, past surgical history and problem list. Problem list updated.  Objective:   Vitals:   08/16/16 1302  BP: (!) 115/57  Pulse: 78  Weight: 241 lb 4.8 oz (109.5 kg)    Fetal Status: Fetal Heart Rate (bpm): NST   Movement: Present     General:  Alert, oriented and cooperative. Patient is in no acute distress.  Skin: Skin is warm and dry. No rash noted.   Cardiovascular: Normal heart rate noted  Respiratory: Normal respiratory effort, no problems with respiration noted  Abdomen: Soft, gravid, appropriate for gestational age. Pain/Pressure: Present     Pelvic:  Cervical exam deferred        Extremities: Normal range of motion.  Edema: Trace  Mental Status: Normal mood and affect. Normal behavior. Normal judgment and thought content.   Assessment and Plan:  Pregnancy: [id number] at 7827w2d  1. History of intrauterine fetal death, currently pregnant NST reviewed and reactive. For u/s for growth on Thursday - Fetal nonstress test  2. Supervision of high risk pregnancy, antepartum Continue prenatal care.  3. Previous C-section x 6 Booked for repeat at 39 wks  Preterm labor symptoms and general obstetric precautions including but not limited to vaginal  bleeding, contractions, leaking of fluid and fetal movement were reviewed in detail with the patient. Please refer to After Visit Summary for other counseling recommendations.   Return in about 7 days (around 08/23/2016) for Ob fu and NST. Reva Boresanya S Charlyn Vialpando, MD

## 2016-08-16 NOTE — Patient Instructions (Signed)
Third Trimester of Pregnancy The third trimester is from week 29 through week 40 (months 7 through 9). The third trimester is a time when the unborn baby (fetus) is growing rapidly. At the end of the ninth month, the fetus is about 20 inches in length and weighs 6-10 pounds. Body changes during your third trimester Your body goes through many changes during pregnancy. The changes vary from woman to woman. During the third trimester:  Your weight will continue to increase. You can expect to gain 25-35 pounds (11-16 kg) by the end of the pregnancy.  You may begin to get stretch marks on your hips, abdomen, and breasts.  You may urinate more often because the fetus is moving lower into your pelvis and pressing on your bladder.  You may develop or continue to have heartburn. This is caused by increased hormones that slow down muscles in the digestive tract.  You may develop or continue to have constipation because increased hormones slow digestion and cause the muscles that push waste through your intestines to relax.  You may develop hemorrhoids. These are swollen veins (varicose veins) in the rectum that can itch or be painful.  You may develop swollen, bulging veins (varicose veins) in your legs.  You may have increased body aches in the pelvis, back, or thighs. This is due to weight gain and increased hormones that are relaxing your joints.  You may have changes in your hair. These can include thickening of your hair, rapid growth, and changes in texture. Some women also have hair loss during or after pregnancy, or hair that feels dry or thin. Your hair will most likely return to normal after your baby is born.  Your breasts will continue to grow and they will continue to become tender. A yellow fluid (colostrum) may leak from your breasts. This is the first milk you are producing for your baby.  Your belly button may stick out.  You may notice more swelling in your hands, face, or  ankles.  You may have increased tingling or numbness in your hands, arms, and legs. The skin on your belly may also feel numb.  You may feel short of breath because of your expanding uterus.  You may have more problems sleeping. This can be caused by the size of your belly, increased need to urinate, and an increase in your body's metabolism.  You may notice the fetus "dropping," or moving lower in your abdomen.  You may have increased vaginal discharge.  Your cervix becomes thin and soft (effaced) near your due date. What to expect at prenatal visits You will have prenatal exams every 2 weeks until week 36. Then you will have weekly prenatal exams. During a routine prenatal visit:  You will be weighed to make sure you and the fetus are growing normally.  Your blood pressure will be taken.  Your abdomen will be measured to track your baby's growth.  The fetal heartbeat will be listened to.  Any test results from the previous visit will be discussed.  You may have a cervical check near your due date to see if you have effaced. At around 36 weeks, your health care provider will check your cervix. At the same time, your health care provider will also perform a test on the secretions of the vaginal tissue. This test is to determine if a type of bacteria, Group B streptococcus, is present. Your health care provider will explain this further. Your health care provider may ask you:    What your birth plan is.  How you are feeling.  If you are feeling the baby move.  If you have had any abnormal symptoms, such as leaking fluid, bleeding, severe headaches, or abdominal cramping.  If you are using any tobacco products, including cigarettes, chewing tobacco, and electronic cigarettes.  If you have any questions. Other tests or screenings that may be performed during your third trimester include:  Blood tests that check for low iron levels (anemia).  Fetal testing to check the health,  activity level, and growth of the fetus. Testing is done if you have certain medical conditions or if there are problems during the pregnancy.  Nonstress test (NST). This test checks the health of your baby to make sure there are no signs of problems, such as the baby not getting enough oxygen. During this test, a belt is placed around your belly. The baby is made to move, and its heart rate is monitored during movement. What is false labor? False labor is a condition in which you feel small, irregular tightenings of the muscles in the womb (contractions) that eventually go away. These are called Braxton Hicks contractions. Contractions may last for hours, days, or even weeks before true labor sets in. If contractions come at regular intervals, become more frequent, increase in intensity, or become painful, you should see your health care provider. What are the signs of labor?  Abdominal cramps.  Regular contractions that start at 10 minutes apart and become stronger and more frequent with time.  Contractions that start on the top of the uterus and spread down to the lower abdomen and back.  Increased pelvic pressure and dull back pain.  A watery or bloody mucus discharge that comes from the vagina.  Leaking of amniotic fluid. This is also known as your "water breaking." It could be a slow trickle or a gush. Let your doctor know if it has a color or strange odor. If you have any of these signs, call your health care provider right away, even if it is before your due date. Follow these instructions at home: Eating and drinking  Continue to eat regular, healthy meals.  Do not eat:  Raw meat or meat spreads.  Unpasteurized milk or cheese.  Unpasteurized juice.  Store-made salad.  Refrigerated smoked seafood.  Hot dogs or deli meat, unless they are piping hot.  More than 6 ounces of albacore tuna a week.  Shark, swordfish, king mackerel, or tile fish.  Store-made salads.  Raw  sprouts, such as mung bean or alfalfa sprouts.  Take prenatal vitamins as told by your health care provider.  Take 1000 mg of calcium daily as told by your health care provider.  If you develop constipation:  Take over-the-counter or prescription medicines.  Drink enough fluid to keep your urine clear or pale yellow.  Eat foods that are high in fiber, such as fresh fruits and vegetables, whole grains, and beans.  Limit foods that are high in fat and processed sugars, such as fried and sweet foods. Activity  Exercise only as directed by your health care provider. Healthy pregnant women should aim for 2 hours and 30 minutes of moderate exercise per week. If you experience any pain or discomfort while exercising, stop.  Avoid heavy lifting.  Do not exercise in extreme heat or humidity, or at high altitudes.  Wear low-heel, comfortable shoes.  Practice good posture.  Do not travel far distances unless it is absolutely necessary and only with the approval   of your health care provider.  Wear your seat belt at all times while in a car, on a bus, or on a plane.  Take frequent breaks and rest with your legs elevated if you have leg cramps or low back pain.  Do not use hot tubs, steam rooms, or saunas.  You may continue to have sex unless your health care provider tells you otherwise. Lifestyle  Do not use any products that contain nicotine or tobacco, such as cigarettes and e-cigarettes. If you need help quitting, ask your health care provider.  Do not drink alcohol.  Do not use any medicinal herbs or unprescribed drugs. These chemicals affect the formation and growth of the baby.  If you develop varicose veins:  Wear support pantyhose or compression stockings as told by your healthcare provider.  Elevate your feet for 15 minutes, 3-4 times a day.  Wear a supportive maternity bra to help with breast tenderness. General instructions  Take over-the-counter and prescription  medicines only as told by your health care provider. There are medicines that are either safe or unsafe to take during pregnancy.  Take warm sitz baths to soothe any pain or discomfort caused by hemorrhoids. Use hemorrhoid cream or witch hazel if your health care provider approves.  Avoid cat litter boxes and soil used by cats. These carry germs that can cause birth defects in the baby. If you have a cat, ask someone to clean the litter box for you.  To prepare for the arrival of your baby:  Take prenatal classes to understand, practice, and ask questions about the labor and delivery.  Make a trial run to the hospital.  Visit the hospital and tour the maternity area.  Arrange for maternity or paternity leave through employers.  Arrange for family and friends to take care of pets while you are in the hospital.  Purchase a rear-facing car seat and make sure you know how to install it in your car.  Pack your hospital bag.  Prepare the baby's nursery. Make sure to remove all pillows and stuffed animals from the baby's crib to prevent suffocation.  Visit your dentist if you have not gone during your pregnancy. Use a soft toothbrush to brush your teeth and be gentle when you floss.  Keep all prenatal follow-up visits as told by your health care provider. This is important. Contact a health care provider if:  You are unsure if you are in labor or if your water has broken.  You become dizzy.  You have mild pelvic cramps, pelvic pressure, or nagging pain in your abdominal area.  You have lower back pain.  You have persistent nausea, vomiting, or diarrhea.  You have an unusual or bad smelling vaginal discharge.  You have pain when you urinate. Get help right away if:  You have a fever.  You are leaking fluid from your vagina.  You have spotting or bleeding from your vagina.  You have severe abdominal pain or cramping.  You have rapid weight loss or weight gain.  You have  shortness of breath with chest pain.  You notice sudden or extreme swelling of your face, hands, ankles, feet, or legs.  Your baby makes fewer than 10 movements in 2 hours.  You have severe headaches that do not go away with medicine.  You have vision changes. Summary  The third trimester is from week 29 through week 40, months 7 through 9. The third trimester is a time when the unborn baby (fetus)   is growing rapidly.  During the third trimester, your discomfort may increase as you and your baby continue to gain weight. You may have abdominal, leg, and back pain, sleeping problems, and an increased need to urinate.  During the third trimester your breasts will keep growing and they will continue to become tender. A yellow fluid (colostrum) may leak from your breasts. This is the first milk you are producing for your baby.  False labor is a condition in which you feel small, irregular tightenings of the muscles in the womb (contractions) that eventually go away. These are called Braxton Hicks contractions. Contractions may last for hours, days, or even weeks before true labor sets in.  Signs of labor can include: abdominal cramps; regular contractions that start at 10 minutes apart and become stronger and more frequent with time; watery or bloody mucus discharge that comes from the vagina; increased pelvic pressure and dull back pain; and leaking of amniotic fluid. This information is not intended to replace advice given to you by your health care provider. Make sure you discuss any questions you have with your health care provider. Document Released: 07/13/2001 Document Revised: 12/25/2015 Document Reviewed: 09/19/2012 [employer] Interactive Patient Education  2017 [employer]   Breastfeeding Deciding to breastfeed is one of the best choices you can make for you and your baby. A change in hormones during pregnancy causes your breast tissue to grow and increases the number and size of your  milk ducts. These hormones also allow proteins, sugars, and fats from your blood supply to make breast milk in your milk-producing glands. Hormones prevent breast milk from being released before your baby is born as well as prompt milk flow after birth. Once breastfeeding has begun, thoughts of your baby, as well as his or her sucking or crying, can stimulate the release of milk from your milk-producing glands. Benefits of breastfeeding For Your Baby  Your first milk (colostrum) helps your baby's digestive system function better.  There are antibodies in your milk that help your baby fight off infections.  Your baby has a lower incidence of asthma, allergies, and sudden infant death syndrome.  The nutrients in breast milk are better for your baby than infant formulas and are designed uniquely for your baby's needs.  Breast milk improves your baby's brain development.  Your baby is less likely to develop other conditions, such as childhood obesity, asthma, or type 2 diabetes mellitus. For You  Breastfeeding helps to create a very special bond between you and your baby.  Breastfeeding is convenient. Breast milk is always available at the correct temperature and costs nothing.  Breastfeeding helps to burn calories and helps you lose the weight gained during pregnancy.  Breastfeeding makes your uterus contract to its prepregnancy size faster and slows bleeding (lochia) after you give birth.  Breastfeeding helps to lower your risk of developing type 2 diabetes mellitus, osteoporosis, and breast or ovarian cancer later in life. Signs that your baby is hungry Early Signs of Hunger  Increased alertness or activity.  Stretching.  Movement of the head from side to side.  Movement of the head and opening of the mouth when the corner of the mouth or cheek is stroked (rooting).  Increased sucking sounds, smacking lips, cooing, sighing, or squeaking.  Hand-to-mouth movements.  Increased  sucking of fingers or hands. Late Signs of Hunger  Fussing.  Intermittent crying. Extreme Signs of Hunger  Signs of extreme hunger will require calming and consoling before your baby will   be able to breastfeed successfully. Do not wait for the following signs of extreme hunger to occur before you initiate breastfeeding:  Restlessness.  A loud, strong cry.  Screaming. Breastfeeding basics  Breastfeeding Initiation  Find a comfortable place to sit or lie down, with your neck and back well supported.  Place a pillow or rolled up blanket under your baby to bring him or her to the level of your breast (if you are seated). Nursing pillows are specially designed to help support your arms and your baby while you breastfeed.  Make sure that your baby's abdomen is facing your abdomen.  Gently massage your breast. With your fingertips, massage from your chest wall toward your nipple in a circular motion. This encourages milk flow. You may need to continue this action during the feeding if your milk flows slowly.  Support your breast with 4 fingers underneath and your thumb above your nipple. Make sure your fingers are well away from your nipple and your baby's mouth.  Stroke your baby's lips gently with your finger or nipple.  When your baby's mouth is open wide enough, quickly bring your baby to your breast, placing your entire nipple and as much of the colored area around your nipple (areola) as possible into your baby's mouth.  More areola should be visible above your baby's upper lip than below the lower lip.  Your baby's tongue should be between his or her lower gum and your breast.  Ensure that your baby's mouth is correctly positioned around your nipple (latched). Your baby's lips should create a seal on your breast and be turned out (everted).  It is common for your baby to suck about 2-3 minutes in order to start the flow of breast milk. Latching  Teaching your baby how to latch  on to your breast properly is very important. An improper latch can cause nipple pain and decreased milk supply for you and poor weight gain in your baby. Also, if your baby is not latched onto your nipple properly, he or she may swallow some air during feeding. This can make your baby fussy. Burping your baby when you switch breasts during the feeding can help to get rid of the air. However, teaching your baby to latch on properly is still the best way to prevent fussiness from swallowing air while breastfeeding. Signs that your baby has successfully latched on to your nipple:  Silent tugging or silent sucking, without causing you pain.  Swallowing heard between every 3-4 sucks.  Muscle movement above and in front of his or her ears while sucking. Signs that your baby has not successfully latched on to nipple:  Sucking sounds or smacking sounds from your baby while breastfeeding.  Nipple pain. If you think your baby has not latched on correctly, slip your finger into the corner of your baby's mouth to break the suction and place it between your baby's gums. Attempt breastfeeding initiation again. Signs of Successful Breastfeeding  Signs from your baby:  A gradual decrease in the number of sucks or complete cessation of sucking.  Falling asleep.  Relaxation of his or her body.  Retention of a small amount of milk in his or her mouth.  Letting go of your breast by himself or herself. Signs from you:  Breasts that have increased in firmness, weight, and size 1-3 hours after feeding.  Breasts that are softer immediately after breastfeeding.  Increased milk volume, as well as a change in milk consistency and color by   the fifth day of breastfeeding.  Nipples that are not sore, cracked, or bleeding. Signs That Your Baby is Getting Enough Milk  Wetting at least 1-2 diapers during the first 24 hours after birth.  Wetting at least 5-6 diapers every 24 hours for the first week after  birth. The urine should be clear or pale yellow by 5 days after birth.  Wetting 6-8 diapers every 24 hours as your baby continues to grow and develop.  At least 3 stools in a 24-hour period by age 5 days. The stool should be soft and yellow.  At least 3 stools in a 24-hour period by age 7 days. The stool should be seedy and yellow.  No loss of weight greater than 10% of birth weight during the first 3 days of age.  Average weight gain of 4-7 ounces (113-198 g) per week after age 4 days.  Consistent daily weight gain by age 5 days, without weight loss after the age of 2 weeks. After a feeding, your baby may spit up a small amount. This is common. Breastfeeding frequency and duration Frequent feeding will help you make more milk and can prevent sore nipples and breast engorgement. Breastfeed when you feel the need to reduce the fullness of your breasts or when your baby shows signs of hunger. This is called "breastfeeding on demand." Avoid introducing a pacifier to your baby while you are working to establish breastfeeding (the first 4-6 weeks after your baby is born). After this time you may choose to use a pacifier. Research has shown that pacifier use during the first year of a baby's life decreases the risk of sudden infant death syndrome (SIDS). Allow your baby to feed on each breast as long as he or she wants. Breastfeed until your baby is finished feeding. When your baby unlatches or falls asleep while feeding from the first breast, offer the second breast. Because newborns are often sleepy in the first few weeks of life, you may need to awaken your baby to get him or her to feed. Breastfeeding times will vary from baby to baby. However, the following rules can serve as a guide to help you ensure that your baby is properly fed:  Newborns (babies 4 weeks of age or younger) may breastfeed every 1-3 hours.  Newborns should not go longer than 3 hours during the day or 5 hours during the night  without breastfeeding.  You should breastfeed your baby a minimum of 8 times in a 24-hour period until you begin to introduce solid foods to your baby at around 6 months of age. Breast milk pumping Pumping and storing breast milk allows you to ensure that your baby is exclusively fed your breast milk, even at times when you are unable to breastfeed. This is especially important if you are going back to work while you are still breastfeeding or when you are not able to be present during feedings. Your lactation consultant can give you guidelines on how long it is safe to store breast milk. A breast pump is a machine that allows you to pump milk from your breast into a sterile bottle. The pumped breast milk can then be stored in a refrigerator or freezer. Some breast pumps are operated by hand, while others use electricity. Ask your lactation consultant which type will work best for you. Breast pumps can be purchased, but some hospitals and breastfeeding support groups lease breast pumps on a monthly basis. A lactation consultant can teach you how to   hand express breast milk, if you prefer not to use a pump. Caring for your breasts while you breastfeed Nipples can become dry, cracked, and sore while breastfeeding. The following recommendations can help keep your breasts moisturized and healthy:  Avoid using soap on your nipples.  Wear a supportive bra. Although not required, special nursing bras and tank tops are designed to allow access to your breasts for breastfeeding without taking off your entire bra or top. Avoid wearing underwire-style bras or extremely tight bras.  Air dry your nipples for 3-4minutes after each feeding.  Use only cotton bra pads to absorb leaked breast milk. Leaking of breast milk between feedings is normal.  Use lanolin on your nipples after breastfeeding. Lanolin helps to maintain your skin's normal moisture barrier. If you use pure lanolin, you do not need to wash it off  before feeding your baby again. Pure lanolin is not toxic to your baby. You may also hand express a few drops of breast milk and gently massage that milk into your nipples and allow the milk to air dry. In the first few weeks after giving birth, some women experience extremely full breasts (engorgement). Engorgement can make your breasts feel heavy, warm, and tender to the touch. Engorgement peaks within 3-5 days after you give birth. The following recommendations can help ease engorgement:  Completely empty your breasts while breastfeeding or pumping. You may want to start by applying warm, moist heat (in the shower or with warm water-soaked hand towels) just before feeding or pumping. This increases circulation and helps the milk flow. If your baby does not completely empty your breasts while breastfeeding, pump any extra milk after he or she is finished.  Wear a snug bra (nursing or regular) or tank top for 1-2 days to signal your body to slightly decrease milk production.  Apply ice packs to your breasts, unless this is too uncomfortable for you.  Make sure that your baby is latched on and positioned properly while breastfeeding. If engorgement persists after 48 hours of following these recommendations, contact your health care provider or a lactation consultant. Overall health care recommendations while breastfeeding  Eat healthy foods. Alternate between meals and snacks, eating 3 of each per day. Because what you eat affects your breast milk, some of the foods may make your baby more irritable than usual. Avoid eating these foods if you are sure that they are negatively affecting your baby.  Drink milk, fruit juice, and water to satisfy your thirst (about 10 glasses a day).  Rest often, relax, and continue to take your prenatal vitamins to prevent fatigue, stress, and anemia.  Continue breast self-awareness checks.  Avoid chewing and smoking tobacco. Chemicals from cigarettes that pass  into breast milk and exposure to secondhand smoke may harm your baby.  Avoid alcohol and drug use, including marijuana. Some medicines that may be harmful to your baby can pass through breast milk. It is important to ask your health care provider before taking any medicine, including all over-the-counter and prescription medicine as well as vitamin and herbal supplements. It is possible to become pregnant while breastfeeding. If birth control is desired, ask your health care provider about options that will be safe for your baby. Contact a health care provider if:  You feel like you want to stop breastfeeding or have become frustrated with breastfeeding.  You have painful breasts or nipples.  Your nipples are cracked or bleeding.  Your breasts are red, tender, or warm.  You have   a swollen area on either breast.  You have a fever or chills.  You have nausea or vomiting.  You have drainage other than breast milk from your nipples.  Your breasts do not become full before feedings by the fifth day after you give birth.  You feel sad and depressed.  Your baby is too sleepy to eat well.  Your baby is having trouble sleeping.  Your baby is wetting less than 3 diapers in a 24-hour period.  Your baby has less than 3 stools in a 24-hour period.  Your baby's skin or the white part of his or her eyes becomes yellow.  Your baby is not gaining weight by 5 days of age. Get help right away if:  Your baby is overly tired (lethargic) and does not want to wake up and feed.  Your baby develops an unexplained fever. This information is not intended to replace advice given to you by your health care provider. Make sure you discuss any questions you have with your health care provider. Document Released: 07/19/2005 Document Revised: 12/31/2015 Document Reviewed: 01/10/2013 Elsevier Interactive Patient Education  2017 Elsevier Inc.  

## 2016-08-16 NOTE — Progress Notes (Signed)
US for growth, BPP and Genetic Counseling scheduled 1/18.

## 2016-08-19 ENCOUNTER — Encounter (HOSPITAL_COMMUNITY): Payer: Medicaid Other

## 2016-08-19 ENCOUNTER — Ambulatory Visit (HOSPITAL_COMMUNITY): Admission: RE | Admit: 2016-08-19 | Payer: Medicaid Other | Source: Ambulatory Visit

## 2016-08-20 ENCOUNTER — Encounter (HOSPITAL_COMMUNITY): Payer: Self-pay

## 2016-08-23 ENCOUNTER — Other Ambulatory Visit: Payer: Medicaid Other | Admitting: Obstetrics & Gynecology

## 2016-08-25 ENCOUNTER — Other Ambulatory Visit: Payer: Medicaid Other | Admitting: Obstetrics and Gynecology

## 2016-08-26 ENCOUNTER — Other Ambulatory Visit: Payer: Self-pay | Admitting: Advanced Practice Midwife

## 2016-08-26 ENCOUNTER — Encounter (HOSPITAL_COMMUNITY): Payer: Self-pay

## 2016-08-26 ENCOUNTER — Ambulatory Visit (HOSPITAL_COMMUNITY)
Admission: RE | Admit: 2016-08-26 | Discharge: 2016-08-26 | Disposition: A | Discharge status: Home / Self Care | Payer: Medicaid Other | Source: Ambulatory Visit | Attending: Advanced Practice Midwife | Admitting: Advanced Practice Midwife

## 2016-08-26 ENCOUNTER — Ambulatory Visit (INDEPENDENT_AMBULATORY_CARE_PROVIDER_SITE_OTHER): Payer: Medicaid Other | Admitting: Family Medicine

## 2016-08-26 VITALS — BP 102/57 | HR 84 | Wt 244.5 lb

## 2016-08-26 DIAGNOSIS — O09299 Supervision of pregnancy with other poor reproductive or obstetric history, unspecified trimester: Secondary | ICD-10-CM

## 2016-08-26 DIAGNOSIS — Z3A36 36 weeks gestation of pregnancy: Secondary | ICD-10-CM | POA: Diagnosis present

## 2016-08-26 DIAGNOSIS — Z363 Encounter for antenatal screening for malformations: Secondary | ICD-10-CM | POA: Insufficient documentation

## 2016-08-26 DIAGNOSIS — O09293 Supervision of pregnancy with other poor reproductive or obstetric history, third trimester: Secondary | ICD-10-CM

## 2016-08-26 DIAGNOSIS — O0990 Supervision of high risk pregnancy, unspecified, unspecified trimester: Secondary | ICD-10-CM

## 2016-08-26 DIAGNOSIS — Z8279 Family history of other congenital malformations, deformations and chromosomal abnormalities: Secondary | ICD-10-CM

## 2016-08-26 NOTE — Progress Notes (Signed)
Genetic Counseling  High-Risk Gestation Note  Appointment Date:  08/26/2016 Referred By: Cathy [name],* Date of Birth:  1986-05-26 Partner:  Tilden Dome   Pregnancy History: [id number] Estimated Date of Delivery: 09/18/16 Estimated Gestational Age: 59w5dAttending: MRenella Cunas MD  I met with Ms. Cathy [name] for genetic counseling because of a previous child with Down syndrome.  In summary:  Discussed family history of Down syndrome  The couple's first child had trisomy 286 patient has limited information given that the child was adopted out of the family    Reviewed  Discussed options of screening / testing  NIPS - declined given her advanced gestational age  Amniocentesis- declined  Discussed general population carrier screening options   CF- performed 07/15/16 and negative for mutations screened  SMA- declined  Hemoglobinopathies - previously performed (07/21/04) and within normal limits  We began by reviewing the family history in detail. The patient reported that her first child has Down syndrome. She is currently 31years old, and the patient has limited information given that she was adopted out of the family. The patient described prenatal features of ventriculomegaly and reported that trisomy 22was diagnosed for her daughter postnatally. Medical records were not available to verify the reported diagnosis. The couple's subsequent four children are healthy, and the patient reported her most recent pregnancy resulted in demise at 347weeks gestation. Cathy [name] reported her maternal half-aunt (her mother's paternal half-sister) has Down syndrome.   We discussed that 95% of cases of Down syndrome are not inherited and are the result of non-disjunction.  Three to 4% of cases of Down syndrome are the result of a translocation involving chromosome #21.  We discussed the option of chromosome analysis to determine if an individual is a carrier of a balanced  translocation involving chromosome #21.  If an individual carries a balanced translocation involving chromosome #21, then the chance to have a baby with Down syndrome would be greater than the maternal age-related risk. She was counseled regarding maternal age and the association with risk for chromosome conditions due to nondisjunction.  We reviewed chromosomes and nondisjunction.  We specifically discussed Down syndrome (trisomy 227, trisomies 152and 160 and sex chromosome aneuploidies (47,XXX and 47,XXY) including the common features and prognoses of each.   We reviewed that literature suggests that the risk of recurrence for a homo/heterotrisomy depends on the maternal age at the index pregnancy, the maternal age at the time of the subsequent pregnancy, and the specific trisomy (Warburton 2004).  Considering that Cathy [name] was less than 29years old at the time of delivery with her daughter and that she is currently 31y.o., her adjusted risk for Down syndrome (homotrisomy) in the current pregnancy is ~1 in 125 (1 in 1031 x the standard morbidity ratio of 8.2).  The overall risk for any aneuploidy, considering Cathy [name]' maternal age and her history of a previous child with Down syndrome, is estimated to be approximately 1.6%.     We reviewed available screening options including noninvasive prenatal screening (NIPS)/prenatal cell free DNA testing and detailed ultrasound.  We reviewed the benefits and limitations of these tests including the detection rate(s), false positive rate(s), and the specific conditions for which each screens.  She understands that screening tests provide a pregnancy specific risk for specific conditions, but are not considered to be diagnostic.  We reviewed that ultrasound as a screening tool for fetal aneuploidy is limited at this advanced gestational age.  Cathy [name] was also counseled regarding diagnostic testing via amniocentesis.  We reviewed the  approximate 1 in 947-096 risk for complications, including spontaneous preterm labor and delivery. After consideration of all the options, she declined NIPS and amniocentesis, given her advanced gestational age.   A complete ultrasound was performed today.  The ultrasound report will be sent under separate cover.    The family histories were otherwise found to be contributory for a paternal uncle to the patient with intellectual disability. She did not have information regarding a specific etiology. She reported that he does basic daily activities independently but is not able to read or live independently. The father of the pregnancy also reportedly has two maternal half-brothers with learning disabilities. She was counseled that there are many different causes of intellectual disabilities including environmental, multifactorial, and genetic etiologies.  We discussed that a specific diagnosis for intellectual disability can be determined in approximately 50% of these individuals.  In the remaining 50% of individuals, a diagnosis may never be determined.  Regarding genetic causes, we discussed that chromosome aberrations (aneuploidy, deletions, duplications, insertions, and translocations) are responsible for a small percentage of individuals with intellectual disability.  Many individuals with chromosome aberrations have additional differences, including congenital anomalies or minor dysmorphisms.  Likewise, single gene conditions are the underlying cause of intellectual delay in some families.  We discussed that many gene conditions have intellectual disability as a feature, but also often include other physical or medical differences. We discussed that without more specific information, it is difficult to provide an accurate risk assessment.  Further genetic counseling is warranted if more information is obtained. Without further information regarding the provided family history, an accurate genetic risk  cannot be calculated. Further genetic counseling is warranted if more information is obtained.  Cathy [name] was provided with written information regarding cystic fibrosis (CF), spinal muscular atrophy (SMA) and hemoglobinopathies including the carrier frequency, availability of carrier screening and prenatal diagnosis if indicated.  In addition, we discussed that CF and hemoglobinopathies are routinely screened for as part of the [place] newborn screening panel. CF carrier screening and hemoglobin electrophoresis were previously performed and were within normal range per the patient's OB records.  After further discussion, she declined screening for SMA.  Cathy [name] denied exposure to environmental toxins or chemical agents. She denied the use of alcohol or street drugs. She reported smoking 1 cigarette per day on average. Associations of smoking were reviewed and cessation encouraged. She denied significant viral illnesses during the course of her pregnancy. Her medical and surgical histories were contributory for previous intrauterine fetal demise.   I counseled Cathy [name] regarding the above risks and available options.  The approximate face-to-face time with the genetic counselor was 40 minutes.  Chipper Oman, MS Certified Genetic Counselor 08/26/2016

## 2016-08-26 NOTE — Progress Notes (Signed)
Genetic Counseling with US and BPP done today

## 2016-08-26 NOTE — Progress Notes (Signed)
    PRENATAL VISIT NOTE  Subjective:  Cathy [name] is a 31 y.o. [id number] at 3421w5d being seen today for ongoing prenatal care.  She is currently monitored for the following issues for this high-risk pregnancy and has Previous cesarean delivery x 5, antepartum; First child has Downs syndrome; Supervision of high-risk pregnancy; History of intrauterine fetal death, currently pregnant; and Late prenatal care on her problem list.  Patient reports no complaints.  Contractions: Not present. Vag. Bleeding: None.  Movement: Present. Denies leaking of fluid.   The following portions of the patient's history were reviewed and updated as appropriate: allergies, current medications, past family history, past medical history, past social history, past surgical history and problem list. Problem list updated.  Objective:   Vitals:   08/26/16 1330  BP: (!) 102/57  Pulse: 84  Weight: 244 lb 8 oz (110.9 kg)    Fetal Status: Fetal Heart Rate (bpm): NST   Movement: Present     General:  Alert, oriented and cooperative. Patient is in no acute distress.  Skin: Skin is warm and dry. No rash noted.   Cardiovascular: Normal heart rate noted  Respiratory: Normal respiratory effort, no problems with respiration noted  Abdomen: Soft, gravid, appropriate for gestational age. Pain/Pressure: Present     Pelvic:  Cervical exam deferred        Extremities: Normal range of motion.     Mental Status: Normal mood and affect. Normal behavior. Normal judgment and thought content.  NST reviewed and reactive.  Assessment and Plan:  Pregnancy: [id number] at 3421w5d  1. History of intrauterine fetal death, currently pregnant Continue 2x/wk testing. Normal u/s today with normal growth. ? Low fluid. - Fetal nonstress test  2. Supervision of high risk pregnancy, antepartum Continue prenatal care.  3. Prior C-section x 5 Scheduled for repeat at 39 wks.  Preterm labor symptoms and general obstetric precautions including  but not limited to vaginal bleeding, contractions, leaking of fluid and fetal movement were reviewed in detail with the patient. Please refer to After Visit Summary for other counseling recommendations.  Return in about 4 days (around 08/30/2016) for NST;  2/1  Ob fu and NST/AFI.   Reva Boresanya S Chrstopher Malenfant, MD

## 2016-08-26 NOTE — Patient Instructions (Signed)
Third Trimester of Pregnancy The third trimester is from week 29 through week 40 (months 7 through 9). The third trimester is a time when the unborn baby (fetus) is growing rapidly. At the end of the ninth month, the fetus is about 20 inches in length and weighs 6-10 pounds. Body changes during your third trimester Your body goes through many changes during pregnancy. The changes vary from woman to woman. During the third trimester:  Your weight will continue to increase. You can expect to gain 25-35 pounds (11-16 kg) by the end of the pregnancy.  You may begin to get stretch marks on your hips, abdomen, and breasts.  You may urinate more often because the fetus is moving lower into your pelvis and pressing on your bladder.  You may develop or continue to have heartburn. This is caused by increased hormones that slow down muscles in the digestive tract.  You may develop or continue to have constipation because increased hormones slow digestion and cause the muscles that push waste through your intestines to relax.  You may develop hemorrhoids. These are swollen veins (varicose veins) in the rectum that can itch or be painful.  You may develop swollen, bulging veins (varicose veins) in your legs.  You may have increased body aches in the pelvis, back, or thighs. This is due to weight gain and increased hormones that are relaxing your joints.  You may have changes in your hair. These can include thickening of your hair, rapid growth, and changes in texture. Some women also have hair loss during or after pregnancy, or hair that feels dry or thin. Your hair will most likely return to normal after your baby is born.  Your breasts will continue to grow and they will continue to become tender. A yellow fluid (colostrum) may leak from your breasts. This is the first milk you are producing for your baby.  Your belly button may stick out.  You may notice more swelling in your hands, face, or  ankles.  You may have increased tingling or numbness in your hands, arms, and legs. The skin on your belly may also feel numb.  You may feel short of breath because of your expanding uterus.  You may have more problems sleeping. This can be caused by the size of your belly, increased need to urinate, and an increase in your body's metabolism.  You may notice the fetus "dropping," or moving lower in your abdomen.  You may have increased vaginal discharge.  Your cervix becomes thin and soft (effaced) near your due date. What to expect at prenatal visits You will have prenatal exams every 2 weeks until week 36. Then you will have weekly prenatal exams. During a routine prenatal visit:  You will be weighed to make sure you and the fetus are growing normally.  Your blood pressure will be taken.  Your abdomen will be measured to track your baby's growth.  The fetal heartbeat will be listened to.  Any test results from the previous visit will be discussed.  You may have a cervical check near your due date to see if you have effaced. At around 36 weeks, your health care provider will check your cervix. At the same time, your health care provider will also perform a test on the secretions of the vaginal tissue. This test is to determine if a type of bacteria, Group B streptococcus, is present. Your health care provider will explain this further. Your health care provider may ask you:    What your birth plan is.  How you are feeling.  If you are feeling the baby move.  If you have had any abnormal symptoms, such as leaking fluid, bleeding, severe headaches, or abdominal cramping.  If you are using any tobacco products, including cigarettes, chewing tobacco, and electronic cigarettes.  If you have any questions. Other tests or screenings that may be performed during your third trimester include:  Blood tests that check for low iron levels (anemia).  Fetal testing to check the health,  activity level, and growth of the fetus. Testing is done if you have certain medical conditions or if there are problems during the pregnancy.  Nonstress test (NST). This test checks the health of your baby to make sure there are no signs of problems, such as the baby not getting enough oxygen. During this test, a belt is placed around your belly. The baby is made to move, and its heart rate is monitored during movement. What is false labor? False labor is a condition in which you feel small, irregular tightenings of the muscles in the womb (contractions) that eventually go away. These are called Braxton Hicks contractions. Contractions may last for hours, days, or even weeks before true labor sets in. If contractions come at regular intervals, become more frequent, increase in intensity, or become painful, you should see your health care provider. What are the signs of labor?  Abdominal cramps.  Regular contractions that start at 10 minutes apart and become stronger and more frequent with time.  Contractions that start on the top of the uterus and spread down to the lower abdomen and back.  Increased pelvic pressure and dull back pain.  A watery or bloody mucus discharge that comes from the vagina.  Leaking of amniotic fluid. This is also known as your "water breaking." It could be a slow trickle or a gush. Let your doctor know if it has a color or strange odor. If you have any of these signs, call your health care provider right away, even if it is before your due date. Follow these instructions at home: Eating and drinking  Continue to eat regular, healthy meals.  Do not eat:  Raw meat or meat spreads.  Unpasteurized milk or cheese.  Unpasteurized juice.  Store-made salad.  Refrigerated smoked seafood.  Hot dogs or deli meat, unless they are piping hot.  More than 6 ounces of albacore tuna a week.  Shark, swordfish, king mackerel, or tile fish.  Store-made salads.  Raw  sprouts, such as mung bean or alfalfa sprouts.  Take prenatal vitamins as told by your health care provider.  Take 1000 mg of calcium daily as told by your health care provider.  If you develop constipation:  Take over-the-counter or prescription medicines.  Drink enough fluid to keep your urine clear or pale yellow.  Eat foods that are high in fiber, such as fresh fruits and vegetables, whole grains, and beans.  Limit foods that are high in fat and processed sugars, such as fried and sweet foods. Activity  Exercise only as directed by your health care provider. Healthy pregnant women should aim for 2 hours and 30 minutes of moderate exercise per week. If you experience any pain or discomfort while exercising, stop.  Avoid heavy lifting.  Do not exercise in extreme heat or humidity, or at high altitudes.  Wear low-heel, comfortable shoes.  Practice good posture.  Do not travel far distances unless it is absolutely necessary and only with the approval   of your health care provider.  Wear your seat belt at all times while in a car, on a bus, or on a plane.  Take frequent breaks and rest with your legs elevated if you have leg cramps or low back pain.  Do not use hot tubs, steam rooms, or saunas.  You may continue to have sex unless your health care provider tells you otherwise. Lifestyle  Do not use any products that contain nicotine or tobacco, such as cigarettes and e-cigarettes. If you need help quitting, ask your health care provider.  Do not drink alcohol.  Do not use any medicinal herbs or unprescribed drugs. These chemicals affect the formation and growth of the baby.  If you develop varicose veins:  Wear support pantyhose or compression stockings as told by your healthcare provider.  Elevate your feet for 15 minutes, 3-4 times a day.  Wear a supportive maternity bra to help with breast tenderness. General instructions  Take over-the-counter and prescription  medicines only as told by your health care provider. There are medicines that are either safe or unsafe to take during pregnancy.  Take warm sitz baths to soothe any pain or discomfort caused by hemorrhoids. Use hemorrhoid cream or witch hazel if your health care provider approves.  Avoid cat litter boxes and soil used by cats. These carry germs that can cause birth defects in the baby. If you have a cat, ask someone to clean the litter box for you.  To prepare for the arrival of your baby:  Take prenatal classes to understand, practice, and ask questions about the labor and delivery.  Make a trial run to the hospital.  Visit the hospital and tour the maternity area.  Arrange for maternity or paternity leave through employers.  Arrange for family and friends to take care of pets while you are in the hospital.  Purchase a rear-facing car seat and make sure you know how to install it in your car.  Pack your hospital bag.  Prepare the baby's nursery. Make sure to remove all pillows and stuffed animals from the baby's crib to prevent suffocation.  Visit your dentist if you have not gone during your pregnancy. Use a soft toothbrush to brush your teeth and be gentle when you floss.  Keep all prenatal follow-up visits as told by your health care provider. This is important. Contact a health care provider if:  You are unsure if you are in labor or if your water has broken.  You become dizzy.  You have mild pelvic cramps, pelvic pressure, or nagging pain in your abdominal area.  You have lower back pain.  You have persistent nausea, vomiting, or diarrhea.  You have an unusual or bad smelling vaginal discharge.  You have pain when you urinate. Get help right away if:  You have a fever.  You are leaking fluid from your vagina.  You have spotting or bleeding from your vagina.  You have severe abdominal pain or cramping.  You have rapid weight loss or weight gain.  You have  shortness of breath with chest pain.  You notice sudden or extreme swelling of your face, hands, ankles, feet, or legs.  Your baby makes fewer than 10 movements in 2 hours.  You have severe headaches that do not go away with medicine.  You have vision changes. Summary  The third trimester is from week 29 through week 40, months 7 through 9. The third trimester is a time when the unborn baby (fetus)   is growing rapidly.  During the third trimester, your discomfort may increase as you and your baby continue to gain weight. You may have abdominal, leg, and back pain, sleeping problems, and an increased need to urinate.  During the third trimester your breasts will keep growing and they will continue to become tender. A yellow fluid (colostrum) may leak from your breasts. This is the first milk you are producing for your baby.  False labor is a condition in which you feel small, irregular tightenings of the muscles in the womb (contractions) that eventually go away. These are called Braxton Hicks contractions. Contractions may last for hours, days, or even weeks before true labor sets in.  Signs of labor can include: abdominal cramps; regular contractions that start at 10 minutes apart and become stronger and more frequent with time; watery or bloody mucus discharge that comes from the vagina; increased pelvic pressure and dull back pain; and leaking of amniotic fluid. This information is not intended to replace advice given to you by your health care provider. Make sure you discuss any questions you have with your health care provider. Document Released: 07/13/2001 Document Revised: 12/25/2015 Document Reviewed: 09/19/2012 [employer] Interactive Patient Education  2017 [employer]   Breastfeeding Deciding to breastfeed is one of the best choices you can make for you and your baby. A change in hormones during pregnancy causes your breast tissue to grow and increases the number and size of your  milk ducts. These hormones also allow proteins, sugars, and fats from your blood supply to make breast milk in your milk-producing glands. Hormones prevent breast milk from being released before your baby is born as well as prompt milk flow after birth. Once breastfeeding has begun, thoughts of your baby, as well as his or her sucking or crying, can stimulate the release of milk from your milk-producing glands. Benefits of breastfeeding For Your Baby  Your first milk (colostrum) helps your baby's digestive system function better.  There are antibodies in your milk that help your baby fight off infections.  Your baby has a lower incidence of asthma, allergies, and sudden infant death syndrome.  The nutrients in breast milk are better for your baby than infant formulas and are designed uniquely for your baby's needs.  Breast milk improves your baby's brain development.  Your baby is less likely to develop other conditions, such as childhood obesity, asthma, or type 2 diabetes mellitus. For You  Breastfeeding helps to create a very special bond between you and your baby.  Breastfeeding is convenient. Breast milk is always available at the correct temperature and costs nothing.  Breastfeeding helps to burn calories and helps you lose the weight gained during pregnancy.  Breastfeeding makes your uterus contract to its prepregnancy size faster and slows bleeding (lochia) after you give birth.  Breastfeeding helps to lower your risk of developing type 2 diabetes mellitus, osteoporosis, and breast or ovarian cancer later in life. Signs that your baby is hungry Early Signs of Hunger  Increased alertness or activity.  Stretching.  Movement of the head from side to side.  Movement of the head and opening of the mouth when the corner of the mouth or cheek is stroked (rooting).  Increased sucking sounds, smacking lips, cooing, sighing, or squeaking.  Hand-to-mouth movements.  Increased  sucking of fingers or hands. Late Signs of Hunger  Fussing.  Intermittent crying. Extreme Signs of Hunger  Signs of extreme hunger will require calming and consoling before your baby will   be able to breastfeed successfully. Do not wait for the following signs of extreme hunger to occur before you initiate breastfeeding:  Restlessness.  A loud, strong cry.  Screaming. Breastfeeding basics  Breastfeeding Initiation  Find a comfortable place to sit or lie down, with your neck and back well supported.  Place a pillow or rolled up blanket under your baby to bring him or her to the level of your breast (if you are seated). Nursing pillows are specially designed to help support your arms and your baby while you breastfeed.  Make sure that your baby's abdomen is facing your abdomen.  Gently massage your breast. With your fingertips, massage from your chest wall toward your nipple in a circular motion. This encourages milk flow. You may need to continue this action during the feeding if your milk flows slowly.  Support your breast with 4 fingers underneath and your thumb above your nipple. Make sure your fingers are well away from your nipple and your baby's mouth.  Stroke your baby's lips gently with your finger or nipple.  When your baby's mouth is open wide enough, quickly bring your baby to your breast, placing your entire nipple and as much of the colored area around your nipple (areola) as possible into your baby's mouth.  More areola should be visible above your baby's upper lip than below the lower lip.  Your baby's tongue should be between his or her lower gum and your breast.  Ensure that your baby's mouth is correctly positioned around your nipple (latched). Your baby's lips should create a seal on your breast and be turned out (everted).  It is common for your baby to suck about 2-3 minutes in order to start the flow of breast milk. Latching  Teaching your baby how to latch  on to your breast properly is very important. An improper latch can cause nipple pain and decreased milk supply for you and poor weight gain in your baby. Also, if your baby is not latched onto your nipple properly, he or she may swallow some air during feeding. This can make your baby fussy. Burping your baby when you switch breasts during the feeding can help to get rid of the air. However, teaching your baby to latch on properly is still the best way to prevent fussiness from swallowing air while breastfeeding. Signs that your baby has successfully latched on to your nipple:  Silent tugging or silent sucking, without causing you pain.  Swallowing heard between every 3-4 sucks.  Muscle movement above and in front of his or her ears while sucking. Signs that your baby has not successfully latched on to nipple:  Sucking sounds or smacking sounds from your baby while breastfeeding.  Nipple pain. If you think your baby has not latched on correctly, slip your finger into the corner of your baby's mouth to break the suction and place it between your baby's gums. Attempt breastfeeding initiation again. Signs of Successful Breastfeeding  Signs from your baby:  A gradual decrease in the number of sucks or complete cessation of sucking.  Falling asleep.  Relaxation of his or her body.  Retention of a small amount of milk in his or her mouth.  Letting go of your breast by himself or herself. Signs from you:  Breasts that have increased in firmness, weight, and size 1-3 hours after feeding.  Breasts that are softer immediately after breastfeeding.  Increased milk volume, as well as a change in milk consistency and color by   the fifth day of breastfeeding.  Nipples that are not sore, cracked, or bleeding. Signs That Your Baby is Getting Enough Milk  Wetting at least 1-2 diapers during the first 24 hours after birth.  Wetting at least 5-6 diapers every 24 hours for the first week after  birth. The urine should be clear or pale yellow by 5 days after birth.  Wetting 6-8 diapers every 24 hours as your baby continues to grow and develop.  At least 3 stools in a 24-hour period by age 5 days. The stool should be soft and yellow.  At least 3 stools in a 24-hour period by age 7 days. The stool should be seedy and yellow.  No loss of weight greater than 10% of birth weight during the first 3 days of age.  Average weight gain of 4-7 ounces (113-198 g) per week after age 4 days.  Consistent daily weight gain by age 5 days, without weight loss after the age of 2 weeks. After a feeding, your baby may spit up a small amount. This is common. Breastfeeding frequency and duration Frequent feeding will help you make more milk and can prevent sore nipples and breast engorgement. Breastfeed when you feel the need to reduce the fullness of your breasts or when your baby shows signs of hunger. This is called "breastfeeding on demand." Avoid introducing a pacifier to your baby while you are working to establish breastfeeding (the first 4-6 weeks after your baby is born). After this time you may choose to use a pacifier. Research has shown that pacifier use during the first year of a baby's life decreases the risk of sudden infant death syndrome (SIDS). Allow your baby to feed on each breast as long as he or she wants. Breastfeed until your baby is finished feeding. When your baby unlatches or falls asleep while feeding from the first breast, offer the second breast. Because newborns are often sleepy in the first few weeks of life, you may need to awaken your baby to get him or her to feed. Breastfeeding times will vary from baby to baby. However, the following rules can serve as a guide to help you ensure that your baby is properly fed:  Newborns (babies 4 weeks of age or younger) may breastfeed every 1-3 hours.  Newborns should not go longer than 3 hours during the day or 5 hours during the night  without breastfeeding.  You should breastfeed your baby a minimum of 8 times in a 24-hour period until you begin to introduce solid foods to your baby at around 6 months of age. Breast milk pumping Pumping and storing breast milk allows you to ensure that your baby is exclusively fed your breast milk, even at times when you are unable to breastfeed. This is especially important if you are going back to work while you are still breastfeeding or when you are not able to be present during feedings. Your lactation consultant can give you guidelines on how long it is safe to store breast milk. A breast pump is a machine that allows you to pump milk from your breast into a sterile bottle. The pumped breast milk can then be stored in a refrigerator or freezer. Some breast pumps are operated by hand, while others use electricity. Ask your lactation consultant which type will work best for you. Breast pumps can be purchased, but some hospitals and breastfeeding support groups lease breast pumps on a monthly basis. A lactation consultant can teach you how to   hand express breast milk, if you prefer not to use a pump. Caring for your breasts while you breastfeed Nipples can become dry, cracked, and sore while breastfeeding. The following recommendations can help keep your breasts moisturized and healthy:  Avoid using soap on your nipples.  Wear a supportive bra. Although not required, special nursing bras and tank tops are designed to allow access to your breasts for breastfeeding without taking off your entire bra or top. Avoid wearing underwire-style bras or extremely tight bras.  Air dry your nipples for 3-4minutes after each feeding.  Use only cotton bra pads to absorb leaked breast milk. Leaking of breast milk between feedings is normal.  Use lanolin on your nipples after breastfeeding. Lanolin helps to maintain your skin's normal moisture barrier. If you use pure lanolin, you do not need to wash it off  before feeding your baby again. Pure lanolin is not toxic to your baby. You may also hand express a few drops of breast milk and gently massage that milk into your nipples and allow the milk to air dry. In the first few weeks after giving birth, some women experience extremely full breasts (engorgement). Engorgement can make your breasts feel heavy, warm, and tender to the touch. Engorgement peaks within 3-5 days after you give birth. The following recommendations can help ease engorgement:  Completely empty your breasts while breastfeeding or pumping. You may want to start by applying warm, moist heat (in the shower or with warm water-soaked hand towels) just before feeding or pumping. This increases circulation and helps the milk flow. If your baby does not completely empty your breasts while breastfeeding, pump any extra milk after he or she is finished.  Wear a snug bra (nursing or regular) or tank top for 1-2 days to signal your body to slightly decrease milk production.  Apply ice packs to your breasts, unless this is too uncomfortable for you.  Make sure that your baby is latched on and positioned properly while breastfeeding. If engorgement persists after 48 hours of following these recommendations, contact your health care provider or a lactation consultant. Overall health care recommendations while breastfeeding  Eat healthy foods. Alternate between meals and snacks, eating 3 of each per day. Because what you eat affects your breast milk, some of the foods may make your baby more irritable than usual. Avoid eating these foods if you are sure that they are negatively affecting your baby.  Drink milk, fruit juice, and water to satisfy your thirst (about 10 glasses a day).  Rest often, relax, and continue to take your prenatal vitamins to prevent fatigue, stress, and anemia.  Continue breast self-awareness checks.  Avoid chewing and smoking tobacco. Chemicals from cigarettes that pass  into breast milk and exposure to secondhand smoke may harm your baby.  Avoid alcohol and drug use, including marijuana. Some medicines that may be harmful to your baby can pass through breast milk. It is important to ask your health care provider before taking any medicine, including all over-the-counter and prescription medicine as well as vitamin and herbal supplements. It is possible to become pregnant while breastfeeding. If birth control is desired, ask your health care provider about options that will be safe for your baby. Contact a health care provider if:  You feel like you want to stop breastfeeding or have become frustrated with breastfeeding.  You have painful breasts or nipples.  Your nipples are cracked or bleeding.  Your breasts are red, tender, or warm.  You have   a swollen area on either breast.  You have a fever or chills.  You have nausea or vomiting.  You have drainage other than breast milk from your nipples.  Your breasts do not become full before feedings by the fifth day after you give birth.  You feel sad and depressed.  Your baby is too sleepy to eat well.  Your baby is having trouble sleeping.  Your baby is wetting less than 3 diapers in a 24-hour period.  Your baby has less than 3 stools in a 24-hour period.  Your baby's skin or the white part of his or her eyes becomes yellow.  Your baby is not gaining weight by 5 days of age. Get help right away if:  Your baby is overly tired (lethargic) and does not want to wake up and feed.  Your baby develops an unexplained fever. This information is not intended to replace advice given to you by your health care provider. Make sure you discuss any questions you have with your health care provider. Document Released: 07/19/2005 Document Revised: 12/31/2015 Document Reviewed: 01/10/2013 Elsevier Interactive Patient Education  2017 Elsevier Inc.  

## 2016-08-27 DIAGNOSIS — Z3A36 36 weeks gestation of pregnancy: Secondary | ICD-10-CM | POA: Insufficient documentation

## 2016-08-31 ENCOUNTER — Other Ambulatory Visit: Payer: Medicaid Other | Admitting: Obstetrics and Gynecology

## 2016-08-31 ENCOUNTER — Telehealth (HOSPITAL_COMMUNITY): Payer: Self-pay | Admitting: *Deleted

## 2016-08-31 NOTE — Telephone Encounter (Signed)
Preadmission screen  

## 2016-09-02 ENCOUNTER — Ambulatory Visit: Payer: Self-pay

## 2016-09-02 ENCOUNTER — Ambulatory Visit (INDEPENDENT_AMBULATORY_CARE_PROVIDER_SITE_OTHER): Payer: Medicaid Other | Admitting: Family Medicine

## 2016-09-02 ENCOUNTER — Telehealth (HOSPITAL_COMMUNITY): Payer: Self-pay | Admitting: *Deleted

## 2016-09-02 VITALS — BP 102/54 | HR 81 | Wt 244.3 lb

## 2016-09-02 DIAGNOSIS — Z3689 Encounter for other specified antenatal screening: Secondary | ICD-10-CM

## 2016-09-02 DIAGNOSIS — O34219 Maternal care for unspecified type scar from previous cesarean delivery: Secondary | ICD-10-CM | POA: Diagnosis not present

## 2016-09-02 DIAGNOSIS — O0990 Supervision of high risk pregnancy, unspecified, unspecified trimester: Secondary | ICD-10-CM

## 2016-09-02 DIAGNOSIS — O09293 Supervision of pregnancy with other poor reproductive or obstetric history, third trimester: Secondary | ICD-10-CM

## 2016-09-02 DIAGNOSIS — O09299 Supervision of pregnancy with other poor reproductive or obstetric history, unspecified trimester: Secondary | ICD-10-CM

## 2016-09-02 NOTE — Progress Notes (Signed)
Pt informed that the ultrasound is considered a limited OB ultrasound and is not intended to be a complete ultrasound exam.  Patient also informed that the ultrasound is not being completed with the intent of assessing for fetal or placental anomalies or any pelvic abnormalities.  Explained that the purpose of today's ultrasound is to assess for presentation and amniotic fluid volume.  Patient acknowledges the purpose of the exam and the limitations of the study.    Repeat C/S scheduled on 2/11.

## 2016-09-02 NOTE — Progress Notes (Addendum)
   PRENATAL VISIT NOTE  Subjective:  Cathy [name] is a 31 y.o. [id number] at 5242w5d being seen today for ongoing prenatal care.  She is currently monitored for the following issues for this high-risk pregnancy and has Previous cesarean delivery x 5, antepartum; First child has Downs syndrome; Supervision of high-risk pregnancy; History of intrauterine fetal death, currently pregnant; Late prenatal care; and [redacted] weeks gestation of pregnancy on her problem list.  Patient reports no complaints.  Contractions: Not present. Vag. Bleeding: None.  Movement: Present. Denies leaking of fluid.   The following portions of the patient's history were reviewed and updated as appropriate: allergies, current medications, past family history, past medical history, past social history, past surgical history and problem list. Problem list updated.  Objective:   Vitals:   09/02/16 1314  BP: (!) 102/54  Pulse: 81  Weight: 244 lb 4.8 oz (110.8 kg)    Fetal Status: Fetal Heart Rate (bpm): NST   Movement: Present  Presentation: Vertex  General:  Alert, oriented and cooperative. Patient is in no acute distress.  Skin: Skin is warm and dry. No rash noted.   Cardiovascular: Normal heart rate noted  Respiratory: Normal respiratory effort, no problems with respiration noted  Abdomen: Soft, gravid, appropriate for gestational age. Pain/Pressure: Present     Pelvic:  Cervical exam deferred        Extremities: Normal range of motion.  Edema: Trace  Mental Status: Normal mood and affect. Normal behavior. Normal judgment and thought content.  NST reviewed and reactive.  Assessment and Plan:  Pregnancy: A5W0981G7P3215 at 7642w5d  1. History of intrauterine fetal death, currently pregnant Continue 2x/wk testing - Fetal nonstress test - US OB Limited  2. Supervision of high risk pregnancy, antepartum   3. Previous cesarean delivery x 5, antepartum Booked for RCS at 39 wks  Term labor symptoms and general obstetric  precautions including but not limited to vaginal bleeding, contractions, leaking of fluid and fetal movement were reviewed in detail with the patient. Please refer to After Visit Summary for other counseling recommendations.  Return in about 4 days (around 09/06/2016) for NST only;  2/9  Ob fu and NST/AFI.   Reva Boresanya S Pratt, MD

## 2016-09-02 NOTE — Telephone Encounter (Signed)
Preadmission screen  

## 2016-09-02 NOTE — Patient Instructions (Signed)
Third Trimester of Pregnancy The third trimester is from week 29 through week 40 (months 7 through 9). The third trimester is a time when the unborn baby (fetus) is growing rapidly. At the end of the ninth month, the fetus is about 20 inches in length and weighs 6-10 pounds. Body changes during your third trimester Your body goes through many changes during pregnancy. The changes vary from woman to woman. During the third trimester:  Your weight will continue to increase. You can expect to gain 25-35 pounds (11-16 kg) by the end of the pregnancy.  You may begin to get stretch marks on your hips, abdomen, and breasts.  You may urinate more often because the fetus is moving lower into your pelvis and pressing on your bladder.  You may develop or continue to have heartburn. This is caused by increased hormones that slow down muscles in the digestive tract.  You may develop or continue to have constipation because increased hormones slow digestion and cause the muscles that push waste through your intestines to relax.  You may develop hemorrhoids. These are swollen veins (varicose veins) in the rectum that can itch or be painful.  You may develop swollen, bulging veins (varicose veins) in your legs.  You may have increased body aches in the pelvis, back, or thighs. This is due to weight gain and increased hormones that are relaxing your joints.  You may have changes in your hair. These can include thickening of your hair, rapid growth, and changes in texture. Some women also have hair loss during or after pregnancy, or hair that feels dry or thin. Your hair will most likely return to normal after your baby is born.  Your breasts will continue to grow and they will continue to become tender. A yellow fluid (colostrum) may leak from your breasts. This is the first milk you are producing for your baby.  Your belly button may stick out.  You may notice more swelling in your hands, face, or  ankles.  You may have increased tingling or numbness in your hands, arms, and legs. The skin on your belly may also feel numb.  You may feel short of breath because of your expanding uterus.  You may have more problems sleeping. This can be caused by the size of your belly, increased need to urinate, and an increase in your body's metabolism.  You may notice the fetus "dropping," or moving lower in your abdomen.  You may have increased vaginal discharge.  Your cervix becomes thin and soft (effaced) near your due date. What to expect at prenatal visits You will have prenatal exams every 2 weeks until week 36. Then you will have weekly prenatal exams. During a routine prenatal visit:  You will be weighed to make sure you and the fetus are growing normally.  Your blood pressure will be taken.  Your abdomen will be measured to track your baby's growth.  The fetal heartbeat will be listened to.  Any test results from the previous visit will be discussed.  You may have a cervical check near your due date to see if you have effaced. At around 36 weeks, your health care provider will check your cervix. At the same time, your health care provider will also perform a test on the secretions of the vaginal tissue. This test is to determine if a type of bacteria, Group B streptococcus, is present. Your health care provider will explain this further. Your health care provider may ask you:    What your birth plan is.  How you are feeling.  If you are feeling the baby move.  If you have had any abnormal symptoms, such as leaking fluid, bleeding, severe headaches, or abdominal cramping.  If you are using any tobacco products, including cigarettes, chewing tobacco, and electronic cigarettes.  If you have any questions. Other tests or screenings that may be performed during your third trimester include:  Blood tests that check for low iron levels (anemia).  Fetal testing to check the health,  activity level, and growth of the fetus. Testing is done if you have certain medical conditions or if there are problems during the pregnancy.  Nonstress test (NST). This test checks the health of your baby to make sure there are no signs of problems, such as the baby not getting enough oxygen. During this test, a belt is placed around your belly. The baby is made to move, and its heart rate is monitored during movement. What is false labor? False labor is a condition in which you feel small, irregular tightenings of the muscles in the womb (contractions) that eventually go away. These are called Braxton Hicks contractions. Contractions may last for hours, days, or even weeks before true labor sets in. If contractions come at regular intervals, become more frequent, increase in intensity, or become painful, you should see your health care provider. What are the signs of labor?  Abdominal cramps.  Regular contractions that start at 10 minutes apart and become stronger and more frequent with time.  Contractions that start on the top of the uterus and spread down to the lower abdomen and back.  Increased pelvic pressure and dull back pain.  A watery or bloody mucus discharge that comes from the vagina.  Leaking of amniotic fluid. This is also known as your "water breaking." It could be a slow trickle or a gush. Let your doctor know if it has a color or strange odor. If you have any of these signs, call your health care provider right away, even if it is before your due date. Follow these instructions at home: Eating and drinking  Continue to eat regular, healthy meals.  Do not eat:  Raw meat or meat spreads.  Unpasteurized milk or cheese.  Unpasteurized juice.  Store-made salad.  Refrigerated smoked seafood.  Hot dogs or deli meat, unless they are piping hot.  More than 6 ounces of albacore tuna a week.  Shark, swordfish, king mackerel, or tile fish.  Store-made salads.  Raw  sprouts, such as mung bean or alfalfa sprouts.  Take prenatal vitamins as told by your health care provider.  Take 1000 mg of calcium daily as told by your health care provider.  If you develop constipation:  Take over-the-counter or prescription medicines.  Drink enough fluid to keep your urine clear or pale yellow.  Eat foods that are high in fiber, such as fresh fruits and vegetables, whole grains, and beans.  Limit foods that are high in fat and processed sugars, such as fried and sweet foods. Activity  Exercise only as directed by your health care provider. Healthy pregnant women should aim for 2 hours and 30 minutes of moderate exercise per week. If you experience any pain or discomfort while exercising, stop.  Avoid heavy lifting.  Do not exercise in extreme heat or humidity, or at high altitudes.  Wear low-heel, comfortable shoes.  Practice good posture.  Do not travel far distances unless it is absolutely necessary and only with the approval   of your health care provider.  Wear your seat belt at all times while in a car, on a bus, or on a plane.  Take frequent breaks and rest with your legs elevated if you have leg cramps or low back pain.  Do not use hot tubs, steam rooms, or saunas.  You may continue to have sex unless your health care provider tells you otherwise. Lifestyle  Do not use any products that contain nicotine or tobacco, such as cigarettes and e-cigarettes. If you need help quitting, ask your health care provider.  Do not drink alcohol.  Do not use any medicinal herbs or unprescribed drugs. These chemicals affect the formation and growth of the baby.  If you develop varicose veins:  Wear support pantyhose or compression stockings as told by your healthcare provider.  Elevate your feet for 15 minutes, 3-4 times a day.  Wear a supportive maternity bra to help with breast tenderness. General instructions  Take over-the-counter and prescription  medicines only as told by your health care provider. There are medicines that are either safe or unsafe to take during pregnancy.  Take warm sitz baths to soothe any pain or discomfort caused by hemorrhoids. Use hemorrhoid cream or witch hazel if your health care provider approves.  Avoid cat litter boxes and soil used by cats. These carry germs that can cause birth defects in the baby. If you have a cat, ask someone to clean the litter box for you.  To prepare for the arrival of your baby:  Take prenatal classes to understand, practice, and ask questions about the labor and delivery.  Make a trial run to the hospital.  Visit the hospital and tour the maternity area.  Arrange for maternity or paternity leave through employers.  Arrange for family and friends to take care of pets while you are in the hospital.  Purchase a rear-facing car seat and make sure you know how to install it in your car.  Pack your hospital bag.  Prepare the baby's nursery. Make sure to remove all pillows and stuffed animals from the baby's crib to prevent suffocation.  Visit your dentist if you have not gone during your pregnancy. Use a soft toothbrush to brush your teeth and be gentle when you floss.  Keep all prenatal follow-up visits as told by your health care provider. This is important. Contact a health care provider if:  You are unsure if you are in labor or if your water has broken.  You become dizzy.  You have mild pelvic cramps, pelvic pressure, or nagging pain in your abdominal area.  You have lower back pain.  You have persistent nausea, vomiting, or diarrhea.  You have an unusual or bad smelling vaginal discharge.  You have pain when you urinate. Get help right away if:  You have a fever.  You are leaking fluid from your vagina.  You have spotting or bleeding from your vagina.  You have severe abdominal pain or cramping.  You have rapid weight loss or weight gain.  You have  shortness of breath with chest pain.  You notice sudden or extreme swelling of your face, hands, ankles, feet, or legs.  Your baby makes fewer than 10 movements in 2 hours.  You have severe headaches that do not go away with medicine.  You have vision changes. Summary  The third trimester is from week 29 through week 40, months 7 through 9. The third trimester is a time when the unborn baby (fetus)   is growing rapidly.  During the third trimester, your discomfort may increase as you and your baby continue to gain weight. You may have abdominal, leg, and back pain, sleeping problems, and an increased need to urinate.  During the third trimester your breasts will keep growing and they will continue to become tender. A yellow fluid (colostrum) may leak from your breasts. This is the first milk you are producing for your baby.  False labor is a condition in which you feel small, irregular tightenings of the muscles in the womb (contractions) that eventually go away. These are called Braxton Hicks contractions. Contractions may last for hours, days, or even weeks before true labor sets in.  Signs of labor can include: abdominal cramps; regular contractions that start at 10 minutes apart and become stronger and more frequent with time; watery or bloody mucus discharge that comes from the vagina; increased pelvic pressure and dull back pain; and leaking of amniotic fluid. This information is not intended to replace advice given to you by your health care provider. Make sure you discuss any questions you have with your health care provider. Document Released: 07/13/2001 Document Revised: 12/25/2015 Document Reviewed: 09/19/2012 [employer] Interactive Patient Education  2017 [employer]   Breastfeeding Deciding to breastfeed is one of the best choices you can make for you and your baby. A change in hormones during pregnancy causes your breast tissue to grow and increases the number and size of your  milk ducts. These hormones also allow proteins, sugars, and fats from your blood supply to make breast milk in your milk-producing glands. Hormones prevent breast milk from being released before your baby is born as well as prompt milk flow after birth. Once breastfeeding has begun, thoughts of your baby, as well as his or her sucking or crying, can stimulate the release of milk from your milk-producing glands. Benefits of breastfeeding For Your Baby  Your first milk (colostrum) helps your baby's digestive system function better.  There are antibodies in your milk that help your baby fight off infections.  Your baby has a lower incidence of asthma, allergies, and sudden infant death syndrome.  The nutrients in breast milk are better for your baby than infant formulas and are designed uniquely for your baby's needs.  Breast milk improves your baby's brain development.  Your baby is less likely to develop other conditions, such as childhood obesity, asthma, or type 2 diabetes mellitus. For You  Breastfeeding helps to create a very special bond between you and your baby.  Breastfeeding is convenient. Breast milk is always available at the correct temperature and costs nothing.  Breastfeeding helps to burn calories and helps you lose the weight gained during pregnancy.  Breastfeeding makes your uterus contract to its prepregnancy size faster and slows bleeding (lochia) after you give birth.  Breastfeeding helps to lower your risk of developing type 2 diabetes mellitus, osteoporosis, and breast or ovarian cancer later in life. Signs that your baby is hungry Early Signs of Hunger  Increased alertness or activity.  Stretching.  Movement of the head from side to side.  Movement of the head and opening of the mouth when the corner of the mouth or cheek is stroked (rooting).  Increased sucking sounds, smacking lips, cooing, sighing, or squeaking.  Hand-to-mouth movements.  Increased  sucking of fingers or hands. Late Signs of Hunger  Fussing.  Intermittent crying. Extreme Signs of Hunger  Signs of extreme hunger will require calming and consoling before your baby will   be able to breastfeed successfully. Do not wait for the following signs of extreme hunger to occur before you initiate breastfeeding:  Restlessness.  A loud, strong cry.  Screaming. Breastfeeding basics  Breastfeeding Initiation  Find a comfortable place to sit or lie down, with your neck and back well supported.  Place a pillow or rolled up blanket under your baby to bring him or her to the level of your breast (if you are seated). Nursing pillows are specially designed to help support your arms and your baby while you breastfeed.  Make sure that your baby's abdomen is facing your abdomen.  Gently massage your breast. With your fingertips, massage from your chest wall toward your nipple in a circular motion. This encourages milk flow. You may need to continue this action during the feeding if your milk flows slowly.  Support your breast with 4 fingers underneath and your thumb above your nipple. Make sure your fingers are well away from your nipple and your baby's mouth.  Stroke your baby's lips gently with your finger or nipple.  When your baby's mouth is open wide enough, quickly bring your baby to your breast, placing your entire nipple and as much of the colored area around your nipple (areola) as possible into your baby's mouth.  More areola should be visible above your baby's upper lip than below the lower lip.  Your baby's tongue should be between his or her lower gum and your breast.  Ensure that your baby's mouth is correctly positioned around your nipple (latched). Your baby's lips should create a seal on your breast and be turned out (everted).  It is common for your baby to suck about 2-3 minutes in order to start the flow of breast milk. Latching  Teaching your baby how to latch  on to your breast properly is very important. An improper latch can cause nipple pain and decreased milk supply for you and poor weight gain in your baby. Also, if your baby is not latched onto your nipple properly, he or she may swallow some air during feeding. This can make your baby fussy. Burping your baby when you switch breasts during the feeding can help to get rid of the air. However, teaching your baby to latch on properly is still the best way to prevent fussiness from swallowing air while breastfeeding. Signs that your baby has successfully latched on to your nipple:  Silent tugging or silent sucking, without causing you pain.  Swallowing heard between every 3-4 sucks.  Muscle movement above and in front of his or her ears while sucking. Signs that your baby has not successfully latched on to nipple:  Sucking sounds or smacking sounds from your baby while breastfeeding.  Nipple pain. If you think your baby has not latched on correctly, slip your finger into the corner of your baby's mouth to break the suction and place it between your baby's gums. Attempt breastfeeding initiation again. Signs of Successful Breastfeeding  Signs from your baby:  A gradual decrease in the number of sucks or complete cessation of sucking.  Falling asleep.  Relaxation of his or her body.  Retention of a small amount of milk in his or her mouth.  Letting go of your breast by himself or herself. Signs from you:  Breasts that have increased in firmness, weight, and size 1-3 hours after feeding.  Breasts that are softer immediately after breastfeeding.  Increased milk volume, as well as a change in milk consistency and color by   the fifth day of breastfeeding.  Nipples that are not sore, cracked, or bleeding. Signs That Your Baby is Getting Enough Milk  Wetting at least 1-2 diapers during the first 24 hours after birth.  Wetting at least 5-6 diapers every 24 hours for the first week after  birth. The urine should be clear or pale yellow by 5 days after birth.  Wetting 6-8 diapers every 24 hours as your baby continues to grow and develop.  At least 3 stools in a 24-hour period by age 5 days. The stool should be soft and yellow.  At least 3 stools in a 24-hour period by age 7 days. The stool should be seedy and yellow.  No loss of weight greater than 10% of birth weight during the first 3 days of age.  Average weight gain of 4-7 ounces (113-198 g) per week after age 4 days.  Consistent daily weight gain by age 5 days, without weight loss after the age of 2 weeks. After a feeding, your baby may spit up a small amount. This is common. Breastfeeding frequency and duration Frequent feeding will help you make more milk and can prevent sore nipples and breast engorgement. Breastfeed when you feel the need to reduce the fullness of your breasts or when your baby shows signs of hunger. This is called "breastfeeding on demand." Avoid introducing a pacifier to your baby while you are working to establish breastfeeding (the first 4-6 weeks after your baby is born). After this time you may choose to use a pacifier. Research has shown that pacifier use during the first year of a baby's life decreases the risk of sudden infant death syndrome (SIDS). Allow your baby to feed on each breast as long as he or she wants. Breastfeed until your baby is finished feeding. When your baby unlatches or falls asleep while feeding from the first breast, offer the second breast. Because newborns are often sleepy in the first few weeks of life, you may need to awaken your baby to get him or her to feed. Breastfeeding times will vary from baby to baby. However, the following rules can serve as a guide to help you ensure that your baby is properly fed:  Newborns (babies 4 weeks of age or younger) may breastfeed every 1-3 hours.  Newborns should not go longer than 3 hours during the day or 5 hours during the night  without breastfeeding.  You should breastfeed your baby a minimum of 8 times in a 24-hour period until you begin to introduce solid foods to your baby at around 6 months of age. Breast milk pumping Pumping and storing breast milk allows you to ensure that your baby is exclusively fed your breast milk, even at times when you are unable to breastfeed. This is especially important if you are going back to work while you are still breastfeeding or when you are not able to be present during feedings. Your lactation consultant can give you guidelines on how long it is safe to store breast milk. A breast pump is a machine that allows you to pump milk from your breast into a sterile bottle. The pumped breast milk can then be stored in a refrigerator or freezer. Some breast pumps are operated by hand, while others use electricity. Ask your lactation consultant which type will work best for you. Breast pumps can be purchased, but some hospitals and breastfeeding support groups lease breast pumps on a monthly basis. A lactation consultant can teach you how to   hand express breast milk, if you prefer not to use a pump. Caring for your breasts while you breastfeed Nipples can become dry, cracked, and sore while breastfeeding. The following recommendations can help keep your breasts moisturized and healthy:  Avoid using soap on your nipples.  Wear a supportive bra. Although not required, special nursing bras and tank tops are designed to allow access to your breasts for breastfeeding without taking off your entire bra or top. Avoid wearing underwire-style bras or extremely tight bras.  Air dry your nipples for 3-4minutes after each feeding.  Use only cotton bra pads to absorb leaked breast milk. Leaking of breast milk between feedings is normal.  Use lanolin on your nipples after breastfeeding. Lanolin helps to maintain your skin's normal moisture barrier. If you use pure lanolin, you do not need to wash it off  before feeding your baby again. Pure lanolin is not toxic to your baby. You may also hand express a few drops of breast milk and gently massage that milk into your nipples and allow the milk to air dry. In the first few weeks after giving birth, some women experience extremely full breasts (engorgement). Engorgement can make your breasts feel heavy, warm, and tender to the touch. Engorgement peaks within 3-5 days after you give birth. The following recommendations can help ease engorgement:  Completely empty your breasts while breastfeeding or pumping. You may want to start by applying warm, moist heat (in the shower or with warm water-soaked hand towels) just before feeding or pumping. This increases circulation and helps the milk flow. If your baby does not completely empty your breasts while breastfeeding, pump any extra milk after he or she is finished.  Wear a snug bra (nursing or regular) or tank top for 1-2 days to signal your body to slightly decrease milk production.  Apply ice packs to your breasts, unless this is too uncomfortable for you.  Make sure that your baby is latched on and positioned properly while breastfeeding. If engorgement persists after 48 hours of following these recommendations, contact your health care provider or a lactation consultant. Overall health care recommendations while breastfeeding  Eat healthy foods. Alternate between meals and snacks, eating 3 of each per day. Because what you eat affects your breast milk, some of the foods may make your baby more irritable than usual. Avoid eating these foods if you are sure that they are negatively affecting your baby.  Drink milk, fruit juice, and water to satisfy your thirst (about 10 glasses a day).  Rest often, relax, and continue to take your prenatal vitamins to prevent fatigue, stress, and anemia.  Continue breast self-awareness checks.  Avoid chewing and smoking tobacco. Chemicals from cigarettes that pass  into breast milk and exposure to secondhand smoke may harm your baby.  Avoid alcohol and drug use, including marijuana. Some medicines that may be harmful to your baby can pass through breast milk. It is important to ask your health care provider before taking any medicine, including all over-the-counter and prescription medicine as well as vitamin and herbal supplements. It is possible to become pregnant while breastfeeding. If birth control is desired, ask your health care provider about options that will be safe for your baby. Contact a health care provider if:  You feel like you want to stop breastfeeding or have become frustrated with breastfeeding.  You have painful breasts or nipples.  Your nipples are cracked or bleeding.  Your breasts are red, tender, or warm.  You have   a swollen area on either breast.  You have a fever or chills.  You have nausea or vomiting.  You have drainage other than breast milk from your nipples.  Your breasts do not become full before feedings by the fifth day after you give birth.  You feel sad and depressed.  Your baby is too sleepy to eat well.  Your baby is having trouble sleeping.  Your baby is wetting less than 3 diapers in a 24-hour period.  Your baby has less than 3 stools in a 24-hour period.  Your baby's skin or the white part of his or her eyes becomes yellow.  Your baby is not gaining weight by 5 days of age. Get help right away if:  Your baby is overly tired (lethargic) and does not want to wake up and feed.  Your baby develops an unexplained fever. This information is not intended to replace advice given to you by your health care provider. Make sure you discuss any questions you have with your health care provider. Document Released: 07/19/2005 Document Revised: 12/31/2015 Document Reviewed: 01/10/2013 Elsevier Interactive Patient Education  2017 Elsevier Inc.  

## 2016-09-03 ENCOUNTER — Encounter (HOSPITAL_COMMUNITY): Payer: Self-pay

## 2016-09-06 ENCOUNTER — Ambulatory Visit (INDEPENDENT_AMBULATORY_CARE_PROVIDER_SITE_OTHER): Payer: Medicaid Other | Admitting: *Deleted

## 2016-09-06 VITALS — BP 108/57 | HR 92

## 2016-09-06 DIAGNOSIS — O09293 Supervision of pregnancy with other poor reproductive or obstetric history, third trimester: Secondary | ICD-10-CM

## 2016-09-06 DIAGNOSIS — O09299 Supervision of pregnancy with other poor reproductive or obstetric history, unspecified trimester: Secondary | ICD-10-CM

## 2016-09-06 NOTE — Progress Notes (Signed)
NST reviewed and reactive.  Baseline initially flat then improved after acoustic stim x1. For IOL in 6 days. Had f/u appt for NST/AF in 3 days,  Zowie Lundahl L. Harraway-Smith, M.D., Evern CoreFACOG

## 2016-09-10 ENCOUNTER — Ambulatory Visit: Payer: Self-pay

## 2016-09-10 ENCOUNTER — Ambulatory Visit (INDEPENDENT_AMBULATORY_CARE_PROVIDER_SITE_OTHER): Payer: Medicaid Other | Admitting: Family

## 2016-09-10 ENCOUNTER — Encounter (HOSPITAL_COMMUNITY)
Admission: RE | Admit: 2016-09-10 | Discharge: 2016-09-10 | Disposition: A | Discharge status: Home / Self Care | Payer: Medicaid Other | Source: Ambulatory Visit | Attending: Obstetrics & Gynecology | Admitting: Obstetrics & Gynecology

## 2016-09-10 VITALS — BP 97/54 | HR 76 | Wt 245.3 lb

## 2016-09-10 DIAGNOSIS — O09293 Supervision of pregnancy with other poor reproductive or obstetric history, third trimester: Secondary | ICD-10-CM | POA: Diagnosis not present

## 2016-09-10 DIAGNOSIS — O09299 Supervision of pregnancy with other poor reproductive or obstetric history, unspecified trimester: Secondary | ICD-10-CM

## 2016-09-10 DIAGNOSIS — Z01812 Encounter for preprocedural laboratory examination: Secondary | ICD-10-CM | POA: Diagnosis present

## 2016-09-10 DIAGNOSIS — O0990 Supervision of high risk pregnancy, unspecified, unspecified trimester: Secondary | ICD-10-CM

## 2016-09-10 DIAGNOSIS — Z3689 Encounter for other specified antenatal screening: Secondary | ICD-10-CM

## 2016-09-10 LAB — POCT URINALYSIS DIP (DEVICE)
Glucose, UA: NEGATIVE mg/dL
KETONES UR: NEGATIVE mg/dL
Nitrite: NEGATIVE
PH: 6 (ref 5.0–8.0)
PROTEIN: NEGATIVE mg/dL
SPECIFIC GRAVITY, URINE: 1.025 (ref 1.005–1.030)
Urobilinogen, UA: 2 mg/dL — ABNORMAL HIGH (ref 0.0–1.0)

## 2016-09-10 LAB — CBC
HEMATOCRIT: 32.8 % — AB (ref 36.0–46.0)
Hemoglobin: 10.9 g/dL — ABNORMAL LOW (ref 12.0–15.0)
MCH: 29.1 pg (ref 26.0–34.0)
MCHC: 33.2 g/dL (ref 30.0–36.0)
MCV: 87.7 fL (ref 78.0–100.0)
PLATELETS: 324 10*3/uL (ref 150–400)
RBC: 3.74 MIL/uL — ABNORMAL LOW (ref 3.87–5.11)
RDW: 14.3 % (ref 11.5–15.5)
WBC: 11.9 10*3/uL — AB (ref 4.0–10.5)

## 2016-09-10 NOTE — Patient Instructions (Signed)
22 Cathy [name]  09/10/2016   Your procedure is scheduled on:  09/12/2016  [facility] at 0730 AM.  Pick up the phone at the desk and dial [phone].   Call this number if you have problems the morning of surgery: [phone]   Remember:   Do not eat food:After Midnight.  Do not drink clear liquids: After Midnight.  Take these medicines the morning of surgery with A SIP OF WATER: none   Do not wear jewelry, make-up or nail polish.  Do not wear lotions, powders, or perfumes. Do not wear deodorant.  Do not shave 48 hours prior to surgery.  Do not bring valuables to the hospital.  [facility] is not   responsible for any belongings or valuables brought to the hospital.  Contacts, dentures or bridgework may not be worn into surgery.  Leave suitcase in the car. After surgery it may be brought to your room.  For patients admitted to the hospital, checkout time is 11:00 AM the day of              discharge.   Patients discharged the day of surgery will not be allowed to drive             home.  Name and phone number of your driver: na  Special Instructions:   N/A   Please read over the following fact sheets that you were given:   Surgical Site Infection Prevention

## 2016-09-10 NOTE — Progress Notes (Signed)
Pt informed that the ultrasound is considered a limited OB ultrasound and is not intended to be a complete ultrasound exam.  Patient also informed that the ultrasound is not being completed with the intent of assessing for fetal or placental anomalies or any pelvic abnormalities.  Explained that the purpose of today's ultrasound is to assess for presentation and amniotic fluid volume.  Patient acknowledges the purpose of the exam and the limitations of the study.    Rpt C/S scheduled on 2/11

## 2016-09-10 NOTE — Progress Notes (Signed)
   PRENATAL VISIT NOTE  Subjective:  Cathy [name] is a 31 y.o. [id number] at 5937w6d being seen today for ongoing prenatal care.  She is currently monitored for the following issues for this high-risk pregnancy and has Previous cesarean delivery x 5, antepartum; First child has Downs syndrome; Supervision of high-risk pregnancy; History of intrauterine fetal death, currently pregnant; Late prenatal care; and [redacted] weeks gestation of pregnancy on her problem list.  Patient reports no complaints.  Contractions: Not present. Vag. Bleeding: None.  Movement: Present. Denies leaking of fluid.   The following portions of the patient's history were reviewed and updated as appropriate: allergies, current medications, past family history, past medical history, past social history, past surgical history and problem list. Problem list updated.  Objective:   Vitals:   09/10/16 0912  BP: (!) 97/54  Pulse: 76  Weight: 245 lb 4.8 oz (111.3 kg)    Fetal Status: Fetal Heart Rate (bpm): NST-R Fundal Height: 38 cm Movement: Present  Presentation: Vertex  General:  Alert, oriented and cooperative. Patient is in no acute distress.  Skin: Skin is warm and dry. No rash noted.   Cardiovascular: Normal heart rate noted  Respiratory: Normal respiratory effort, no problems with respiration noted  Abdomen: Soft, gravid, appropriate for gestational age. Pain/Pressure: Present     Pelvic:  Cervical exam deferred        Extremities: Normal range of motion.  Edema: Trace  Mental Status: Normal mood and affect. Normal behavior. Normal judgment and thought content.   Assessment and Plan:  Pregnancy: P2R5188G7P3215 at 137w6d  1. History of intrauterine fetal death, currently pregnant - Fetal nonstress test > reactive   2. Supervision of high risk pregnancy, antepartum - Repeat csection on 09/12/16; pre-op appt today  Term labor symptoms and general obstetric precautions including but not limited to vaginal bleeding,  contractions, leaking of fluid and fetal movement were reviewed in detail with the patient. Please refer to After Visit Summary for other counseling recommendations.  Return in about 6 weeks (around 10/22/2016) for PP visit.  C/S on 2/11.   Eino FarberWalidah Kennith GainN Karim, CNM

## 2016-09-11 LAB — RPR: RPR Ser Ql: NONREACTIVE

## 2016-09-12 ENCOUNTER — Encounter (HOSPITAL_COMMUNITY): Payer: Self-pay | Admitting: *Deleted

## 2016-09-12 ENCOUNTER — Inpatient Hospital Stay (HOSPITAL_COMMUNITY)
Admission: AD | Admit: 2016-09-12 | Discharge: 2016-09-15 | DRG: 765 | Disposition: A | Discharge status: Home / Self Care | Payer: Medicaid Other | Source: Ambulatory Visit | Attending: Family Medicine | Admitting: Family Medicine

## 2016-09-12 ENCOUNTER — Inpatient Hospital Stay (HOSPITAL_COMMUNITY): Payer: Medicaid Other | Admitting: Anesthesiology

## 2016-09-12 ENCOUNTER — Encounter (HOSPITAL_COMMUNITY)
Admission: AD | Disposition: A | Discharge status: Home / Self Care | Payer: Self-pay | Source: Ambulatory Visit | Attending: Family Medicine

## 2016-09-12 ENCOUNTER — Inpatient Hospital Stay (HOSPITAL_COMMUNITY)
Admission: RE | Admit: 2016-09-12 | Payer: Medicaid Other | Source: Ambulatory Visit | Admitting: Obstetrics & Gynecology

## 2016-09-12 DIAGNOSIS — O0930 Supervision of pregnancy with insufficient antenatal care, unspecified trimester: Secondary | ICD-10-CM

## 2016-09-12 DIAGNOSIS — Z833 Family history of diabetes mellitus: Secondary | ICD-10-CM

## 2016-09-12 DIAGNOSIS — F17210 Nicotine dependence, cigarettes, uncomplicated: Secondary | ICD-10-CM | POA: Diagnosis present

## 2016-09-12 DIAGNOSIS — O99334 Smoking (tobacco) complicating childbirth: Secondary | ICD-10-CM | POA: Diagnosis present

## 2016-09-12 DIAGNOSIS — Z8249 Family history of ischemic heart disease and other diseases of the circulatory system: Secondary | ICD-10-CM

## 2016-09-12 DIAGNOSIS — O99214 Obesity complicating childbirth: Secondary | ICD-10-CM | POA: Diagnosis present

## 2016-09-12 DIAGNOSIS — O34211 Maternal care for low transverse scar from previous cesarean delivery: Principal | ICD-10-CM | POA: Diagnosis present

## 2016-09-12 DIAGNOSIS — Z3A39 39 weeks gestation of pregnancy: Secondary | ICD-10-CM

## 2016-09-12 DIAGNOSIS — O34219 Maternal care for unspecified type scar from previous cesarean delivery: Secondary | ICD-10-CM

## 2016-09-12 DIAGNOSIS — Z6841 Body Mass Index (BMI) 40.0 and over, adult: Secondary | ICD-10-CM

## 2016-09-12 DIAGNOSIS — O0990 Supervision of high risk pregnancy, unspecified, unspecified trimester: Secondary | ICD-10-CM

## 2016-09-12 DIAGNOSIS — O09299 Supervision of pregnancy with other poor reproductive or obstetric history, unspecified trimester: Secondary | ICD-10-CM

## 2016-09-12 LAB — PREPARE RBC (CROSSMATCH)

## 2016-09-12 SURGERY — Surgical Case
Anesthesia: Spinal | Site: Abdomen

## 2016-09-12 MED ORDER — FENTANYL CITRATE (PF) 100 MCG/2ML IJ SOLN
INTRAMUSCULAR | Status: DC | PRN
  Administered 2016-09-12: 100 ug via EPIDURAL
  Administered 2016-09-12: 50 ug via INTRAVENOUS

## 2016-09-12 MED ORDER — SCOPOLAMINE 1 MG/3DAYS TD PT72
MEDICATED_PATCH | TRANSDERMAL | Status: AC
  Filled 2016-09-12: qty 1

## 2016-09-12 MED ORDER — MEPERIDINE HCL 25 MG/ML IJ SOLN: 6.2500 mg | INTRAMUSCULAR | Status: DC | PRN

## 2016-09-12 MED ORDER — SIMETHICONE 80 MG PO CHEW
80.0000 mg | CHEWABLE_TABLET | ORAL | Status: DC | PRN
  Administered 2016-09-14: 80 mg via ORAL
  Filled 2016-09-12: qty 1

## 2016-09-12 MED ORDER — LACTATED RINGERS IV SOLN
INTRAVENOUS | Status: DC | PRN
  Administered 2016-09-12 (×2): via INTRAVENOUS

## 2016-09-12 MED ORDER — ACETAMINOPHEN 325 MG PO TABS: 650.0000 mg | ORAL_TABLET | ORAL | Status: DC | PRN

## 2016-09-12 MED ORDER — DIPHENHYDRAMINE HCL 50 MG/ML IJ SOLN
12.5000 mg | INTRAMUSCULAR | Status: DC | PRN
  Administered 2016-09-12: 12.5 mg via INTRAVENOUS

## 2016-09-12 MED ORDER — PHENYLEPHRINE 8 MG IN D5W 100 ML (0.08MG/ML) PREMIX OPTIME
INJECTION | INTRAVENOUS | Status: AC
  Filled 2016-09-12: qty 100

## 2016-09-12 MED ORDER — ONDANSETRON HCL 4 MG/2ML IJ SOLN
INTRAMUSCULAR | Status: AC
  Filled 2016-09-12: qty 2

## 2016-09-12 MED ORDER — MORPHINE SULFATE (PF) 0.5 MG/ML IJ SOLN
INTRAMUSCULAR | Status: DC | PRN
  Administered 2016-09-12: 3 mg via EPIDURAL

## 2016-09-12 MED ORDER — MORPHINE SULFATE (PF) 0.5 MG/ML IJ SOLN
INTRAMUSCULAR | Status: AC
  Filled 2016-09-12: qty 10

## 2016-09-12 MED ORDER — OXYTOCIN 40 UNITS IN LACTATED RINGERS INFUSION - SIMPLE MED: 2.5000 [IU]/h | INTRAVENOUS | Status: AC

## 2016-09-12 MED ORDER — LACTATED RINGERS IV SOLN
INTRAVENOUS | Status: DC
  Administered 2016-09-13: 08:00:00 via INTRAVENOUS

## 2016-09-12 MED ORDER — WITCH HAZEL-GLYCERIN EX PADS: 1.0000 "application " | MEDICATED_PAD | CUTANEOUS | Status: DC | PRN

## 2016-09-12 MED ORDER — NALBUPHINE HCL 10 MG/ML IJ SOLN: 5.0000 mg | INTRAMUSCULAR | Status: DC | PRN

## 2016-09-12 MED ORDER — GENTAMICIN SULFATE 40 MG/ML IJ SOLN
Freq: Once | INTRAMUSCULAR | Status: AC
  Administered 2016-09-12: 115 mL via INTRAVENOUS
  Filled 2016-09-12: qty 9

## 2016-09-12 MED ORDER — ERYTHROMYCIN 5 MG/GM OP OINT
TOPICAL_OINTMENT | OPHTHALMIC | Status: AC
  Filled 2016-09-12: qty 1

## 2016-09-12 MED ORDER — DIPHENHYDRAMINE HCL 50 MG/ML IJ SOLN
INTRAMUSCULAR | Status: AC
  Filled 2016-09-12: qty 1

## 2016-09-12 MED ORDER — ONDANSETRON HCL 4 MG/2ML IJ SOLN
INTRAMUSCULAR | Status: DC | PRN
  Administered 2016-09-12: 4 mg via INTRAVENOUS

## 2016-09-12 MED ORDER — KETOROLAC TROMETHAMINE 30 MG/ML IJ SOLN
INTRAMUSCULAR | Status: AC
  Administered 2016-09-12: 30 mg via INTRAVENOUS
  Filled 2016-09-12: qty 1

## 2016-09-12 MED ORDER — SODIUM CHLORIDE 0.9% FLUSH: 3.0000 mL | INTRAVENOUS | Status: DC | PRN

## 2016-09-12 MED ORDER — NALBUPHINE HCL 10 MG/ML IJ SOLN
5.0000 mg | INTRAMUSCULAR | Status: DC | PRN
  Administered 2016-09-12 – 2016-09-13 (×4): 5 mg via INTRAVENOUS
  Filled 2016-09-12 (×4): qty 1

## 2016-09-12 MED ORDER — SENNOSIDES-DOCUSATE SODIUM 8.6-50 MG PO TABS
2.0000 | ORAL_TABLET | ORAL | Status: DC
  Administered 2016-09-12 – 2016-09-14 (×3): 2 via ORAL
  Filled 2016-09-12 (×3): qty 2

## 2016-09-12 MED ORDER — SCOPOLAMINE 1 MG/3DAYS TD PT72
1.0000 | MEDICATED_PATCH | Freq: Once | TRANSDERMAL | Status: DC
  Administered 2016-09-12: 1.5 mg via TRANSDERMAL

## 2016-09-12 MED ORDER — SCOPOLAMINE 1 MG/3DAYS TD PT72
1.0000 | MEDICATED_PATCH | Freq: Once | TRANSDERMAL | Status: DC
  Filled 2016-09-12: qty 1

## 2016-09-12 MED ORDER — IBUPROFEN 600 MG PO TABS
600.0000 mg | ORAL_TABLET | Freq: Four times a day (QID) | ORAL | Status: DC
  Administered 2016-09-12 – 2016-09-15 (×10): 600 mg via ORAL
  Filled 2016-09-12 (×10): qty 1

## 2016-09-12 MED ORDER — MEASLES, MUMPS & RUBELLA VAC ~~LOC~~ INJ: 0.5000 mL | INJECTION | Freq: Once | SUBCUTANEOUS | Status: DC

## 2016-09-12 MED ORDER — SCOPOLAMINE 1 MG/3DAYS TD PT72
1.0000 | MEDICATED_PATCH | Freq: Once | TRANSDERMAL | Status: AC
  Administered 2016-09-12: 1.5 mg via TRANSDERMAL

## 2016-09-12 MED ORDER — COCONUT OIL OIL
1.0000 "application " | TOPICAL_OIL | Status: DC | PRN
  Administered 2016-09-13: 1 via TOPICAL
  Filled 2016-09-12: qty 120

## 2016-09-12 MED ORDER — PRENATAL MULTIVITAMIN CH
1.0000 | ORAL_TABLET | Freq: Every day | ORAL | Status: DC
  Administered 2016-09-13 – 2016-09-14 (×2): 1 via ORAL
  Filled 2016-09-12 (×2): qty 1

## 2016-09-12 MED ORDER — ZOLPIDEM TARTRATE 5 MG PO TABS: 5.0000 mg | ORAL_TABLET | Freq: Every evening | ORAL | Status: DC | PRN

## 2016-09-12 MED ORDER — HYDROMORPHONE HCL 1 MG/ML IJ SOLN: 0.2500 mg | INTRAMUSCULAR | Status: DC | PRN

## 2016-09-12 MED ORDER — KETOROLAC TROMETHAMINE 30 MG/ML IJ SOLN: 30.0000 mg | Freq: Four times a day (QID) | INTRAMUSCULAR | Status: DC | PRN

## 2016-09-12 MED ORDER — DIPHENHYDRAMINE HCL 25 MG PO CAPS
25.0000 mg | ORAL_CAPSULE | Freq: Four times a day (QID) | ORAL | Status: DC | PRN
  Administered 2016-09-12: 25 mg via ORAL
  Filled 2016-09-12 (×2): qty 1

## 2016-09-12 MED ORDER — BUPIVACAINE IN DEXTROSE 0.75-8.25 % IT SOLN
INTRATHECAL | Status: DC | PRN
Start: 2016-09-12 — End: 2016-09-12
  Administered 2016-09-12: 1.5 mL via INTRATHECAL

## 2016-09-12 MED ORDER — LACTATED RINGERS IV SOLN: INTRAVENOUS | Status: DC

## 2016-09-12 MED ORDER — SODIUM BICARBONATE 8.4 % IV SOLN
INTRAVENOUS | Status: DC | PRN
  Administered 2016-09-12: 1 mL via EPIDURAL

## 2016-09-12 MED ORDER — NALOXONE HCL 0.4 MG/ML IJ SOLN: 0.4000 mg | INTRAMUSCULAR | Status: DC | PRN

## 2016-09-12 MED ORDER — PHENYLEPHRINE 8 MG IN D5W 100 ML (0.08MG/ML) PREMIX OPTIME
INJECTION | INTRAVENOUS | Status: DC | PRN
  Administered 2016-09-12: 60 ug/min via INTRAVENOUS

## 2016-09-12 MED ORDER — ACETAMINOPHEN 500 MG PO TABS
1000.0000 mg | ORAL_TABLET | Freq: Four times a day (QID) | ORAL | Status: AC
  Administered 2016-09-12 – 2016-09-13 (×4): 1000 mg via ORAL
  Filled 2016-09-12 (×4): qty 2

## 2016-09-12 MED ORDER — MENTHOL 3 MG MT LOZG: 1.0000 | LOZENGE | OROMUCOSAL | Status: DC | PRN

## 2016-09-12 MED ORDER — ONDANSETRON HCL 4 MG/2ML IJ SOLN: 4.0000 mg | Freq: Three times a day (TID) | INTRAMUSCULAR | Status: DC | PRN

## 2016-09-12 MED ORDER — NALOXONE HCL 2 MG/2ML IJ SOSY
1.0000 ug/kg/h | PREFILLED_SYRINGE | INTRAVENOUS | Status: DC | PRN
  Filled 2016-09-12: qty 2

## 2016-09-12 MED ORDER — SODIUM CHLORIDE 0.9 % IR SOLN
Status: DC | PRN
  Administered 2016-09-12: 1

## 2016-09-12 MED ORDER — NALBUPHINE HCL 10 MG/ML IJ SOLN: 5.0000 mg | Freq: Once | INTRAMUSCULAR | Status: DC | PRN

## 2016-09-12 MED ORDER — PROMETHAZINE HCL 25 MG/ML IJ SOLN: 6.2500 mg | INTRAMUSCULAR | Status: DC | PRN

## 2016-09-12 MED ORDER — KETOROLAC TROMETHAMINE 30 MG/ML IJ SOLN: 30.0000 mg | Freq: Once | INTRAMUSCULAR | Status: DC

## 2016-09-12 MED ORDER — DIBUCAINE 1 % RE OINT: 1.0000 "application " | TOPICAL_OINTMENT | RECTAL | Status: DC | PRN

## 2016-09-12 MED ORDER — FENTANYL CITRATE (PF) 100 MCG/2ML IJ SOLN
INTRAMUSCULAR | Status: AC
  Filled 2016-09-12: qty 2

## 2016-09-12 MED ORDER — LACTATED RINGERS IV SOLN
INTRAVENOUS | Status: DC
  Administered 2016-09-12: 13:00:00 via INTRAVENOUS

## 2016-09-12 MED ORDER — OXYTOCIN 10 UNIT/ML IJ SOLN
INTRAVENOUS | Status: DC | PRN
  Administered 2016-09-12: 40 [IU] via INTRAVENOUS

## 2016-09-12 MED ORDER — OXYTOCIN 10 UNIT/ML IJ SOLN
INTRAMUSCULAR | Status: AC
  Filled 2016-09-12: qty 4

## 2016-09-12 MED ORDER — DIPHENHYDRAMINE HCL 25 MG PO CAPS
25.0000 mg | ORAL_CAPSULE | ORAL | Status: DC | PRN
  Filled 2016-09-12: qty 1

## 2016-09-12 MED ORDER — SODIUM CHLORIDE 0.9 % IV SOLN: INTRAVENOUS | Status: DC

## 2016-09-12 MED ORDER — TETANUS-DIPHTH-ACELL PERTUSSIS 5-2.5-18.5 LF-MCG/0.5 IM SUSP: 0.5000 mL | Freq: Once | INTRAMUSCULAR | Status: DC

## 2016-09-12 MED ORDER — IBUPROFEN 600 MG PO TABS: 600.0000 mg | ORAL_TABLET | Freq: Four times a day (QID) | ORAL | Status: DC | PRN

## 2016-09-12 MED ORDER — KETOROLAC TROMETHAMINE 30 MG/ML IJ SOLN
30.0000 mg | Freq: Four times a day (QID) | INTRAMUSCULAR | Status: DC | PRN
  Administered 2016-09-12: 30 mg via INTRAVENOUS

## 2016-09-12 MED ORDER — SIMETHICONE 80 MG PO CHEW
80.0000 mg | CHEWABLE_TABLET | ORAL | Status: DC
  Administered 2016-09-12 – 2016-09-14 (×3): 80 mg via ORAL
  Filled 2016-09-12 (×3): qty 1

## 2016-09-12 SURGICAL SUPPLY — 34 items
CHLORAPREP W/TINT 26ML (MISCELLANEOUS) ×3 IMPLANT
CLAMP CORD UMBIL (MISCELLANEOUS) IMPLANT
CLOTH BEACON ORANGE TIMEOUT ST (SAFETY) ×3 IMPLANT
DRAIN JACKSON PRT FLT 7MM (DRAIN) IMPLANT
DRSG OPSITE POSTOP 4X10 (GAUZE/BANDAGES/DRESSINGS) ×3 IMPLANT
ELECT REM PT RETURN 9FT ADLT (ELECTROSURGICAL) ×3
ELECTRODE REM PT RTRN 9FT ADLT (ELECTROSURGICAL) ×1 IMPLANT
EVACUATOR SILICONE 100CC (DRAIN) IMPLANT
EXTRACTOR VACUUM M CUP 4 TUBE (SUCTIONS) IMPLANT
EXTRACTOR VACUUM M CUP 4' TUBE (SUCTIONS)
GLOVE BIO SURGEON STRL SZ7 (GLOVE) ×3 IMPLANT
GLOVE BIOGEL PI IND STRL 7.0 (GLOVE) ×2 IMPLANT
GLOVE BIOGEL PI INDICATOR 7.0 (GLOVE) ×4
GOWN STRL REUS W/TWL LRG LVL3 (GOWN DISPOSABLE) ×6 IMPLANT
KIT ABG SYR 3ML LUER SLIP (SYRINGE) IMPLANT
NEEDLE HYPO 25X5/8 SAFETYGLIDE (NEEDLE) ×3 IMPLANT
NS IRRIG 1000ML POUR BTL (IV SOLUTION) ×3 IMPLANT
PACK C SECTION WH (CUSTOM PROCEDURE TRAY) ×3 IMPLANT
PAD ABD 7.5X8 STRL (GAUZE/BANDAGES/DRESSINGS) ×9 IMPLANT
PAD OB MATERNITY 4.3X12.25 (PERSONAL CARE ITEMS) ×3 IMPLANT
PENCIL SMOKE EVAC W/HOLSTER (ELECTROSURGICAL) ×3 IMPLANT
RETAINER VISCERAL (MISCELLANEOUS) ×3 IMPLANT
RTRCTR C-SECT PINK 25CM LRG (MISCELLANEOUS) ×3 IMPLANT
SPONGE DRAIN TRACH 4X4 STRL 2S (GAUZE/BANDAGES/DRESSINGS) ×9 IMPLANT
SPONGE LAP 18X18 X RAY DECT (DISPOSABLE) ×3 IMPLANT
SUT MNCRL 0 VIOLET CTX 36 (SUTURE) ×2 IMPLANT
SUT MONOCRYL 0 CTX 36 (SUTURE) ×4
SUT PDS AB 0 CTX 60 (SUTURE) ×6 IMPLANT
SUT VIC AB 0 CTX 36 (SUTURE) ×10
SUT VIC AB 0 CTX36XBRD ANBCTRL (SUTURE) ×5 IMPLANT
SUT VIC AB 4-0 KS 27 (SUTURE) ×3 IMPLANT
SYR KIT LINE DRAW 1CC W/FILTR (LINER) ×3 IMPLANT
TOWEL OR 17X24 6PK STRL BLUE (TOWEL DISPOSABLE) ×3 IMPLANT
TRAY FOLEY CATH SILVER 14FR (SET/KITS/TRAYS/PACK) ×3 IMPLANT

## 2016-09-12 NOTE — Progress Notes (Signed)
Dr Penne LashLeggett in to see patient again. Answered all questions patient asked, and patient agrees to continue with procedure. Corabelle Spackman LaughlinBrown Angelicia Lessner, [place] 09/12/2016 11:32 AM

## 2016-09-12 NOTE — Progress Notes (Signed)
Pt O2 saturation dropping when patient falls asleep. 84-87%. RN called T/S MD to clarify order regarding sleep apnea protocol. RN to start pt on O2 when sleeping to maintain sats above 92% per order and MD.

## 2016-09-12 NOTE — Anesthesia Postprocedure Evaluation (Signed)
Anesthesia Post Note  Patient: Cathy [name]  Procedure(s) Performed: Procedure(s) (LRB): CESAREAN SECTION (N/A)  Patient location during evaluation: PACU Anesthesia Type: Spinal Level of consciousness: awake Pain management: pain level controlled Vital Signs Assessment: post-procedure vital signs reviewed and stable Respiratory status: spontaneous breathing Cardiovascular status: stable Postop Assessment: no headache, spinal receding, patient able to bend at knees and no signs of nausea or vomiting Anesthetic complications: no        Last Vitals:  Vitals:   09/12/16 1445 09/12/16 1509  BP: 114/74 (!) 108/57  Pulse: 61 61  Resp: 18 18  Temp:  36.9 C    Last Pain:  Vitals:   09/12/16 1509  TempSrc: Oral  PainSc: 3    Pain Goal:                 Tiron Suski JR,JOHN Jaylenn Altier

## 2016-09-12 NOTE — Progress Notes (Signed)
Patient spoke with Dr Penne LashLeggett, who suggests vertical incision. Patient refuses to undergo this type of incision. Dr Penne LashLeggett requests RN to put patient on fetal monitor for heartrate and contractions prior to discharging patient home.  Cathy [name], [place] 09/12/2016 10:35 AM

## 2016-09-12 NOTE — Anesthesia Procedure Notes (Addendum)
Spinal  Patient location during procedure: OR Start time: 09/12/2016 12:09 PM End time: 09/12/2016 12:13 PM Staffing Anesthesiologist: Leilani AbleHATCHETT, Lotta [place] Performed: anesthesiologist  Preanesthetic Checklist Completed: patient identified, surgical consent, pre-op evaluation, timeout performed, IV checked, risks and benefits discussed and monitors and equipment checked Spinal Block Patient position: sitting Prep: site prepped and draped and DuraPrep Patient monitoring: cardiac monitor, continuous pulse ox, blood pressure and heart rate Approach: midline Location: L3-4 Injection technique: catheter Needle Needle type: Tuohy and Sprotte  Needle gauge: 24 G Needle length: 12.7 cm Needle insertion depth: 7 cm Catheter type: closed end flexible Catheter size: 19 g Catheter at skin depth: 12 cm Assessment Sensory level: T4

## 2016-09-12 NOTE — Anesthesia Preprocedure Evaluation (Signed)
Anesthesia Evaluation  Patient identified by MRN, date of birth, ID band Patient awake    Reviewed: Allergy & Precautions, H&P , NPO status , Patient's Chart, lab work & pertinent test results  Airway Mallampati: II  TM Distance: >3 FB Neck ROM: full    Dental no notable dental hx.    Pulmonary Current Smoker,    Pulmonary exam normal        Cardiovascular negative cardio ROS Normal cardiovascular exam     Neuro/Psych negative neurological ROS  negative psych ROS   GI/Hepatic negative GI ROS, Neg liver ROS,   Endo/Other  Morbid obesity  Renal/GU negative Renal ROS     Musculoskeletal   Abdominal (+) + obese,   Peds  Hematology negative hematology ROS (+)   Anesthesia Other Findings   Reproductive/Obstetrics (+) Pregnancy                             Anesthesia Physical Anesthesia Plan  ASA: III  Anesthesia Plan: Combined Spinal and Epidural   Post-op Pain Management:    Induction:   Airway Management Planned:   Additional Equipment:   Intra-op Plan:   Post-operative Plan:   Informed Consent: I have reviewed the patients History and Physical, chart, labs and discussed the procedure including the risks, benefits and alternatives for the proposed anesthesia with the patient or authorized representative who has indicated his/her understanding and acceptance.     Plan Discussed with: CRNA and Surgeon  Anesthesia Plan Comments:         Anesthesia Quick Evaluation

## 2016-09-12 NOTE — Op Note (Signed)
Caili S Enriques PROCEDURE DATE: 09/12/2016  PREOPERATIVE DIAGNOSIS: Intrauterine pregnancy at  550w1d weeks gestation for repeat cesarean section   POSTOPERATIVE DIAGNOSIS: Intrauterine pregnancy at  8450w1d weeks gestation for repeat cesarean section  with dense lower abdominal wall adhesions  PROCEDURE:    Low Transverse Cesarean Section  SURGEON:  Dr. Elsie LincolnKelly Lesly Joslyn  ASSISTANT: Dr. Christin BachJohn Ferguson  INDICATIONS: Salome Arntiffany S Hagey is a 31 y.o. [id number] at 9650w1d with history of 5 prior cesarean sections.  The risks of cesarean section discussed with the patient included but were not limited to: bleeding which may require transfusion or reoperation; infection which may require antibiotics; injury to bowel, bladder, ureters or other surrounding organs; injury to the fetus; need for additional procedures including hysterectomy in the event of a life-threatening hemorrhage; placental abnormalities wth subsequent pregnancies, incisional problems, thromboembolic phenomenon and other postoperative/anesthesia complications. The patient concurred with the proposed plan, giving informed written consent for the procedure.    FINDINGS:  Viable female  infant in cephalic presentation, 8,9 Apgars, weight to be determined in 1 hour, clear amniotic fluid.  Intact placenta, three vessel cord.  Grossly normal uterus, ovaries and fallopian tubes. .   ANESTHESIA:    Combined spinal epidural  ESTIMATED BLOOD LOSS: 525cc  SPECIMENS: Placenta sent to L & D  COMPLICATIONS: None immediate  PROCEDURE IN DETAIL:  The patient received intravenous antibiotics and had sequential compression devices applied to her lower extremities.  Combined spinal epidural anesthesia was administered  and was found to be adequate. She was then placed in a dorsal supine position with a leftward tilt, and prepped and draped in a sterile manner.  A foley catheter was placed into her bladder and attached to constant gravity.  After an adequate  timeout was performed, a vertical skin incision was made with scalpel and carried through to the underlying layer of fascia. The fascia was incised in the midline and this incision was extended superiorly and inferiorly using the Mayo scissors. The rectus muscles were separated in the midline bluntly and the peritoneum was entered bluntly.   There were dense lower abdominal wall adhesions that made reaching the LUS difficult.  Using careful sharp dissection, the LUS was reached.  Retractors were placed into the peritoneal cavity.   A transverse hysterotomy was made with a scalpel and extended bilaterally bluntly. The bladder blade was then removed. The infant was successfully delivered, and cord was clamped and cut and infant was handed over to awaiting neonatology team. Uterine massage was then administered and the placenta delivered intact with three-vessel cord. The uterus was cleared of clot and debris.  The hysterotomy was closed with 0 Monocryl.  The peritoneum and rectus muscles were noted to be hemostatic.  The a bulk closure with ) PDS was used to close the fascia and rectus muscles with good restoration of anatomy.  The subcutaneus tissue was copiously irrigated and re-approximated with 0-plain suture.  The skin was closed with staples   Pt tolerated the procedure will.  All counts were correct x2.  Pt went to the recovery room in stable condition.

## 2016-09-12 NOTE — Progress Notes (Signed)
Patient verbalizes that she does NOT want to have a BTL at this time, but prefers a [employer] fast streamerMirena. Also, no 30 day papers have been signed. Patient signed consent for cesarean section due to repeat only.  Iram Astorino Lynder ParentsBrown Henderson Frampton, RN Pre-op RN 09/12/2016 9:19 AM

## 2016-09-12 NOTE — Brief Op Note (Signed)
09/12/2016  2:57 PM  PATIENT:  Cathy [name]  31 y.o. female  PRE-OPERATIVE DIAGNOSIS:  repeat cesarean x 5  POST-OPERATIVE DIAGNOSIS:  repeat cesarean x 5  PROCEDURE:  Procedure(s): CESAREAN SECTION (N/A)  SURGEON:  Surgeon(s) and Role:    * Lesly DukesKelly H Demeco Ducksworth, MD - Primary    * Tilda BurrowJohn Ferguson V, MD - Assisting  ANESTHESIA:   epidural and spinal  EBL:  Total I/O In: 3600 [I.V.:3600] Out: 1125 [Urine:600; Blood:525]  BLOOD ADMINISTERED:none  DRAINS: none   LOCAL MEDICATIONS USED:  NONE  SPECIMEN:  No Specimen  DISPOSITION OF SPECIMEN:  N/A  COUNTS:  YES  TOURNIQUET:  * No tourniquets in log *  DICTATION: .Note written in EPIC  PLAN OF CARE: Admit to inpatient   PATIENT DISPOSITION:  PACU - hemodynamically stable.   Delay start of Pharmacological VTE agent (>24hrs) due to surgical blood loss or risk of bleeding: not applicable

## 2016-09-12 NOTE — Lactation Note (Signed)
This note was copied from a baby's chart. Lactation Consultation Note  Patient Name: Cathy [name] XBMWU'XToday's Date: 09/12/2016 Reason for consult: Initial assessment Baby at 4 hr of life. Experienced bf mom reports baby is latching well but she does not think she has enough milk. Discussed baby behavior, feeding frequency, baby belly size, voids, wt loss, breast changes, and nipple care. Mom stated she can manually express and has spoon in room. Given lactation handouts. Aware of OP services and support group. She will call for lactation to observe a latch at the next feeding.     Maternal Data Has patient been taught Hand Expression?: Yes Does the patient have breastfeeding experience prior to this delivery?: Yes  Feeding Feeding Type: Breast Milk Length of feed: 6 min  LATCH Score/Interventions Latch: Repeated attempts needed to sustain latch, nipple held in mouth throughout feeding, stimulation needed to elicit sucking reflex. Intervention(s): Adjust position;Assist with latch;Breast compression  Audible Swallowing: None  Type of Nipple: Everted at rest and after stimulation  Comfort (Breast/Nipple): Soft / non-tender     Hold (Positioning): Assistance needed to correctly position infant at breast and maintain latch. Intervention(s): Position options;Skin to skin  LATCH Score: 6  Lactation Tools Discussed/Used WIC Program: Yes   Consult Status Consult Status: Follow-up Date: 09/13/16 Follow-up type: In-patient    Rulon Eisenmengerlizabeth E Ellyanna Holton 09/12/2016, 4:43 PM

## 2016-09-12 NOTE — Transfer of Care (Signed)
Immediate Anesthesia Transfer of Care Note  Patient: Cathy [name]  Procedure(s) Performed: Procedure(s): CESAREAN SECTION (N/A)  Patient Location: PACU  Anesthesia Type:Spinal  Level of Consciousness: awake and alert   Airway & Oxygen Therapy: Patient Spontanous Breathing  Post-op Assessment: Report given to RN and Post -op Vital signs reviewed and stable  Post vital signs: Reviewed  Last Vitals:  Vitals:   09/12/16 0754 09/12/16 1015  BP: 103/70 (!) 116/59  Pulse: 91 74  Resp: 18 18  Temp: 36.9 C     Last Pain:  Vitals:   09/12/16 0754  TempSrc: Oral         Complications: No apparent anesthesia complications

## 2016-09-12 NOTE — H&P (Signed)
LABOR AND DELIVERY ADMISSION HISTORY AND PHYSICAL NOTE  Cathy [name] is a 31 y.o. female [phone] with IUP at [redacted]w[redacted]d by LMP presenting for elective c-section 2/2 5x prior c-sections & prior IUFD 2/2 placental abruption. The patient denies pain and currently feels no contractions. She reports positive fetal movement. She denies leakage of fluid or vaginal bleeding.  Prenatal History/Complications:  Past Medical History: Past Medical History:  Diagnosis Date  . Vaginal Pap smear, abnormal     Past Surgical History: Past Surgical History:  Procedure Laterality Date  . CESAREAN SECTION     x4  . CESAREAN SECTION N/A 07/29/2014   Procedure: CESAREAN SECTION;  Surgeon: Willodean Rosenthal, MD;  Location: [facility];  Service: Obstetrics;  Laterality: N/A;  . DILATION AND CURETTAGE OF UTERUS    . OVARIAN CYST REMOVAL      Obstetrical History: OB History    Gravida Para Term Preterm AB Living   7 5 3 2 1 5    SAB TAB Ectopic Multiple Live Births   1     1 5       Social History: Social History   Social History  . Marital status: Married    Spouse name: N/A  . Number of children: N/A  . Years of education: N/A   Social History Main Topics  . Smoking status: Current Some Day Smoker    Packs/day: 0.25    Types: Cigarettes  . Smokeless tobacco: Never Used  . Alcohol use No  . Drug use: No  . Sexual activity: Yes    Birth control/ protection: None   Other Topics Concern  . None   Social History Narrative  . None    Family History: Family History  Problem Relation Age of Onset  . Diabetes Maternal Grandmother   . Hypertension Maternal Grandmother   . Hypertension Maternal Grandfather   . Diabetes Paternal Grandmother   . Hypertension Paternal Grandmother   . Hypertension Paternal Grandfather     Allergies: Allergies  Allergen Reactions  . Lactose Intolerance (Gi) Diarrhea  . Penicillins Other (See Comments)    Unknown childhood reaction. Has patient had a  PCN reaction causing immediate rash, facial/tongue/throat swelling, SOB or lightheadedness with hypotension: Unknown Has patient had a PCN reaction causing severe rash involving mucus membranes or skin necrosis: Unknown Has patient had a PCN reaction that required hospitalization Unknown Has patient had a PCN reaction occurring within the last 10 years: Unknown If all of the above answers are "NO", then may proceed with Cephalosporin use.   . Latex Itching, Swelling and Rash    Prescriptions Prior to Admission  Medication Sig Dispense Refill Last Dose  . calcium carbonate (TUMS - DOSED IN MG ELEMENTAL CALCIUM) 500 MG chewable tablet Chew 2 tablets by mouth 3 (three) times daily as needed (sour stomach).   Taking  . Prenatal Multivit-Min-Fe-FA (PRENATAL VITAMINS PO) Take 1 tablet by mouth daily.   Taking     Review of Systems   All systems reviewed and negative except as stated in HPI  Blood pressure 103/70, pulse 91, temperature 98.4 F (36.9 C), temperature source Oral, resp. rate 18, last menstrual period 12/13/2015, SpO2 100 %. General appearance: alert, cooperative, appears stated age and no distress Lungs: clear to auscultation bilaterally; normal work of breathing Heart: regular rate and rhythm; normal s1 & s2, no murmurs, rubs, or gallops Abdomen: soft, non-tender; bowel sounds normal Extremities: No calf swelling or tenderness; no pedal edema, +2 dorsalis pedis pulses  Prenatal labs: ABO, Rh: --/--/B POS (02/09 1040) Antibody: NEG (02/09 1040) Rubella: !Error! RPR: Non Reactive (02/09 1040)  HBsAg: Negative (12/14 0000)  HIV: Non-reactive (12/14 0000)  GBS:   negative 1 hr Glucola: 124 Genetic screening:  NIPS Anatomy ZO:XWRUEAs:normal with anterior placenta  Prenatal Transfer Tool  Maternal Diabetes: No Genetic Screening: Normal Maternal Ultrasounds/Referrals: Normal Fetal Ultrasounds or other Referrals:  None Maternal Substance Abuse:  No Significant Maternal  Medications:  None Significant Maternal Lab Results: Lab values include: Group B Strep negative  Results for orders placed or performed during the hospital encounter of 09/12/16 (from the past 24 hour(s))  Prepare RBC (crossmatch)   Collection Time: 09/12/16  7:40 AM  Result Value Ref Range   Order Confirmation ORDER PROCESSED BY BLOOD BANK     Patient Active Problem List   Diagnosis Date Noted  . [redacted] weeks gestation of pregnancy   . Supervision of high-risk pregnancy 08/04/2016  . History of intrauterine fetal death, currently pregnant 08/04/2016  . Late prenatal care 08/04/2016  . Previous cesarean delivery x 5, antepartum 06/26/2014  . First child has Downs syndrome 06/26/2014    Assessment: Cathy [name] is a 31 y.o. [id number] at 4977w1d here for c-section 2/2 5x prior c-sections & prior IUFD 2/2 placental abruption.  #labor: C-section #Pain: Scopolamine patch(preoperative care) #FWB: Cat 1 #MOF: BF #MOC: Mirena IUD #Circ:  Undecided Pediatrician: Cathy [name]   Ogar Ogar, Medical Student PGY-1 09/12/2016, 9:15 AM  Attending Note  Pt seen and examined.  Lengthy conversation about abdominal entry.  Last operative note described dense rectus/facial adhesions.  I spoke with Dr. Erin FullingHarraway-Smith who remembers the case and recommends vertical skin incision due to the LUS / abdominal wall incision scarring.  Pt was upset but has agreed that safety is best and agrees with vertical skin incision.  [facility](Amber Middleton present for consent).  Blood pressure 103/70, pulse 91, temperature 98.4 F (36.9 C), temperature source Oral, resp. rate 18, last menstrual period 12/13/2015, SpO2 100 %. General appearance: alert, cooperative, appears stated age and no distress Lungs: clear to auscultation bilaterally; normal work of breathing Heart: regular rate and rhythm Abdomen: soft, non-tender Extremities: No calf swelling or tenderness  Comprehensive ROS is negative.  Pregnancy history  reviewed.  Significant for placental abruption and IUFD of 5th pregnancy.    The risks of cesarean section discussed with the patient included but were not limited to: bleeding which may require transfusion or reoperation; infection which may require antibiotics; injury to bowel, bladder, ureters or other surrounding organs; injury to the fetus; need for additional procedures including hysterectomy in the event of a life-threatening hemorrhage; placental abnormalities wth subsequent pregnancies, incisional problems, thromboembolic phenomenon and other postoperative/anesthesia complications. The patient concurred with the proposed plan, giving informed written consent for the procedure.   Pt opts for IUD postpartum.    Cathy [name] 11:39 AM

## 2016-09-13 LAB — CBC
HCT: 30.8 % — ABNORMAL LOW (ref 36.0–46.0)
HEMOGLOBIN: 10.4 g/dL — AB (ref 12.0–15.0)
MCH: 29.6 pg (ref 26.0–34.0)
MCHC: 33.8 g/dL (ref 30.0–36.0)
MCV: 87.7 fL (ref 78.0–100.0)
Platelets: 272 10*3/uL (ref 150–400)
RBC: 3.51 MIL/uL — ABNORMAL LOW (ref 3.87–5.11)
RDW: 14.2 % (ref 11.5–15.5)
WBC: 9.5 10*3/uL (ref 4.0–10.5)

## 2016-09-13 LAB — BIRTH TISSUE RECOVERY COLLECTION (PLACENTA DONATION)

## 2016-09-13 MED ORDER — OXYCODONE-ACETAMINOPHEN 5-325 MG PO TABS
1.0000 | ORAL_TABLET | ORAL | Status: DC | PRN
  Administered 2016-09-13 – 2016-09-15 (×5): 1 via ORAL
  Filled 2016-09-13 (×4): qty 1
  Filled 2016-09-13: qty 2

## 2016-09-13 NOTE — Clinical Social Work Maternal (Signed)
CLINICAL SOCIAL WORK MATERNAL/CHILD NOTE  Patient Details  Name: Cathy [name] MRN: [id number] Date of Birth: 21-Feb-1986  Date:  09/13/2016  Clinical Social Worker Initiating Note:  Laurey Arrow Date/ Time Initiated:  09/13/16/1525     Child's Name:  Cathy [name]   Legal Guardian:  Mother (FOB is Tilden Dome 10/05/86)   Need for Interpreter:  None   Date of Referral:  09/13/16     Reason for Referral:  Late or No Prenatal Care  (Severn initiated after 28w)   Referral Source:  Central Nursery   Address:  [place] [address]  Phone number:  [phone]   Household Members:  Self, Minor Children, Spouse   Natural Supports (not living in the home):  Immediate Family, Spouse/significant other, Parent   Professional Supports: None   Employment: Unemployed   Type of Work:     Education:  Database administrator Resources:  [employer]   Other Resources:  Physicist, medical    Cultural/Religious Considerations Which May Impact Care:  None Reported  Strengths:  Ability to meet basic needs , Home prepared for child    Risk Factors/Current Problems:  None   Cognitive State:  Able to Concentrate , Alert , Linear Thinking , Goal Oriented    Mood/Affect:  Apprehensive , Comfortable , Agitated   CSW Assessment: CSW met with [name] to complete an assessment for Late [facility]. When CSW arrived, [name] was asleep in bed and FOB was resting on the couch.  CSW offered to return at a later time, but [name] was adamant about meeting with CSW at the moment. [name] was apprehensive and appeared agitated with CSW initially, however, throughout the assessment, [name] became understanding of CSW's role. [name] gave CSW permission to meet with [name] while FOB was present.  FOB attended to infant during the assessment and was appropriate with responding to the infant's cues.    CSW inquired about [name]'s late [facility] and [name] became defensive as evident by patient rolling her eyes and  expressing frustration about patient care.  [name] communicated "someone should have been concerned about my daughter that died back in 10-06-13, when the doctors kept telling me nothing was wrong". [name] explained the loss of [name] at 62 weeks and appeared frustrated.  CSW expressed empathy and explained policy and procedure for Late [facility]. [name] denied the use of any substance and became understanding of the policy. CSW informed [name] that the infant's UDS and CDS were pending and CSW will monitor them and make a report if either were positive without an explanation. [name] stated that [name] was late receiving PNC due to [name] not having Medicaid and the Health Dept. Did not have any immediate appointments. [name] acknowledged a hx of CPS involvement when [name] was age 60.  [name] reported that [name] had a down syndrome daughter and due to safety concerns, CPS became involved.  [name] stated that [name] was unable to care for a special needs child at that time, and the child was placed in foster care and was eventually adopted. [name] denied any other CPS concerns.    [name] denied PPD hx, hx of DV, and hx of MH concerns. [name] and FOB communicated that their home is prepared for infant and they have all necessary items for baby.  [name] and FOB denied all other psychosocial stressors and communicated they feel prepared to care for their 5th child. CSW thanked [name] for meeting with CSW and provided [name] with CSW  contact information.  [name] and FOB had no additional questions at this time.  CSW Plan/Description:  Information/Referral to [employer] , [employer] , No Further Intervention Required/No Barriers to Discharge   Laurey Arrow, MSW, LCSW Clinical Social Work [phone]    Dimple Nanas, LCSW 09/13/2016, 3:29 PM

## 2016-09-13 NOTE — Lactation Note (Signed)
This note was copied from a baby's chart. Lactation Consultation Note:  Mother has been exclusively breastfeeding infant. Mother reports that infant is breastfeeding well. Mother denies having any pain with latching. She reports that she always gets cracked nipples. Staff nurse to give mother coconut oil for home use.  Mother was given a hand pump with instructions to use as needed. Mother advised to breastfeed infant on cue and at least 8-12 time in 24 hours. Mother receptive to all teaching.   Patient Name: Cathy [name] VWUJW'JToday's Date: 09/13/2016 Reason for consult: Follow-up assessment   Maternal Data    Feeding    LATCH Score/Interventions                      Lactation Tools Discussed/Used     Consult Status Consult Status: Follow-up Date: 09/13/16 Follow-up type: In-patient    Stevan BornKendrick, Saeed Toren [facility] 09/13/2016, 3:33 PM

## 2016-09-13 NOTE — Progress Notes (Signed)
UR chart review completed.  

## 2016-09-13 NOTE — Anesthesia Postprocedure Evaluation (Addendum)
Anesthesia Post Note  Patient: Cathy [name]  Procedure(s) Performed: Procedure(s) (LRB): CESAREAN SECTION (N/A)  Patient location during evaluation: Mother Baby Anesthesia Type: Combined Spinal/Epidural Level of consciousness: awake and alert and oriented Pain management: pain level controlled Vital Signs Assessment: post-procedure vital signs reviewed and stable Respiratory status: spontaneous breathing and nonlabored ventilation Cardiovascular status: stable Postop Assessment: no headache, no backache, epidural receding, spinal receding, patient able to bend at knees, no signs of nausea or vomiting and adequate PO intake Anesthetic complications: no        Last Vitals:  Vitals:   09/13/16 0214 09/13/16 0612  BP: (!) 101/51 114/67  Pulse: 66 70  Resp: 18 16  Temp: 36.8 C 36.7 C    Last Pain:  Vitals:   09/13/16 0635  TempSrc:   PainSc: 0-No pain   Pain Goal:                 Land O'LakesMalinova,Nataliya Hristova

## 2016-09-13 NOTE — Progress Notes (Signed)
POSTPARTUM PROGRESS NOTE  Post Partum Day 1 Subjective:  Cathy [name] is a 31 y.o. [id number] 5071w2d s/p RLTCS.  No acute events overnight.  Pt denies problems with ambulating, voiding or po intake.  She denies nausea or vomiting.  Pain is well controlled.  She has not had flatus. She has not had bowel movement.  Lochia Minimal.   Objective: Blood pressure 114/67, pulse 70, temperature 98 F (36.7 C), temperature source Oral, resp. rate 16, height 5' (1.524 m), weight 111.1 kg (245 lb), last menstrual period 12/13/2015, SpO2 98 %.  Physical Exam:  General: alert, cooperative and no distress Lochia:normal flow Chest: CTAB Heart: RRR no m/r/g Abdomen: +BS, soft, nontender,  Uterine Fundus: firm DVT Evaluation: No calf swelling or tenderness Extremities: no edema   Recent Labs  09/10/16 1040 09/13/16 0526  HGB 10.9* 10.4*  HCT 32.8* 30.8*    Assessment/Plan:  ASSESSMENT: Cathy Arntiffany S Ganaway is a 31 y.o. [id number] 3671w2d s/p RLTCS. Not passing gas or bowel movement yet.  Continue to encourage ambulation.   Plan for discharge tomorrow   LOS: 1 day   Renne Muscaaniel L Warden, MD PGY-1 Center for [facility], [facility]  09/13/2016, 9:11 AM   CNM attestation:  I have seen and examined this patient; I agree with above documentation in the resident's note.   Cathy Arntiffany S Bubb is a 31 y.o. [phone] reporting no complaints today, bleeding is normal    PE: BP 114/67 (BP Location: Left Arm)   Pulse 70   Temp 98 F (36.7 C) (Oral)   Resp 16   Ht 5' (1.524 m)   Wt 245 lb (111.1 kg)   LMP 12/13/2015 (Approximate)   SpO2 98%   BMI 47.85 kg/m  Gen: calm comfortable, NAD Resp: normal effort, no distress Abd: soft/NT  ROS, labs, PMH reviewed   Plan: D/C on day #3   Tawnya CrookHogan, Heather Donovan, CNM 10:04 AM

## 2016-09-13 NOTE — Addendum Note (Signed)
Addendum  created 09/13/16 0820 by Elgie CongoNataliya H Miette Molenda, CRNA   Sign clinical note

## 2016-09-14 LAB — TYPE AND SCREEN
ABO/RH(D): B POS
Antibody Screen: NEGATIVE
UNIT DIVISION: 0
UNIT DIVISION: 0

## 2016-09-14 MED ORDER — POLYETHYLENE GLYCOL 3350 17 G PO PACK
17.0000 g | PACK | Freq: Two times a day (BID) | ORAL | Status: DC
  Administered 2016-09-14 (×2): 17 g via ORAL
  Filled 2016-09-14 (×3): qty 1

## 2016-09-14 NOTE — Lactation Note (Signed)
This note was copied from a baby's chart. Lactation Consultation Note LC visited at 5158 hours of age. Mom is experienced with breast feeding older children and denies concerns at this time. Chart reviewed with good feedings. Mom to call as needed.   Patient Name: Cathy [name] AVWUJ'WToday's Date: 09/14/2016     Maternal Data    Feeding Feeding Type: Breast Fed Length of feed: 15 min  LATCH Score/Interventions                      Lactation Tools Discussed/Used     Consult Status      Shoptaw, Arvella MerlesJana Lynn 09/14/2016, 11:10 PM

## 2016-09-14 NOTE — Progress Notes (Signed)
Dr Omer JackMumaw notified that honeycomb dressing falling apart.  No drainage noted on dressing   Honeycomb sterile dressing change done

## 2016-09-14 NOTE — Plan of Care (Signed)
Problem: Activity: Goal: Risk for activity intolerance will decrease Outcome: Completed/Met Date Met: 09/14/16 Pt encouraged to ambulate in hallways.  Pt frequently ambulating in room but currently declines to walk in the hallway.   Problem: Urinary Elimination: Goal: Ability to reestablish a normal urinary elimination pattern will improve Outcome: Completed/Met Date Met: 09/14/16 Pt urinating without difficulty.

## 2016-09-14 NOTE — Progress Notes (Signed)
POSTPARTUM PROGRESS NOTE  Post Partum Day #2 Subjective:  Cathy [name] is a 31 y.o. [id number] 1186w3d s/p RLTCS.  No acute events overnight.  Pt denies problems with ambulating, voiding or po intake.  She denies nausea or vomiting.  Pain is well controlled.  She has not had flatus. She has not had bowel movement.  Lochia Minimal.   Objective: Blood pressure (!) 115/58, pulse (!) 56, temperature 97.6 F (36.4 C), temperature source Oral, resp. rate 16, height 5' (1.524 m), weight 111.1 kg (245 lb), last menstrual period 12/13/2015, SpO2 98 %.  Physical Exam:  General: alert, cooperative and no distress Lochia:normal flow Chest: CTAB Heart: RRR no m/r/g Abdomen: +BS, soft, nontender,  Uterine Fundus: firm DVT Evaluation: No calf swelling or tenderness Extremities: no edema   Recent Labs  09/13/16 0526  HGB 10.4*  HCT 30.8*    Assessment/Plan:  ASSESSMENT: Cathy [name] is a 31 y.o. [id number] 6986w3d s/p RLTCS. Patient feels well, but has not had BM or flatus. Will give stool scheduled stool softener and encourage ambulation.  Can be discharged if passing flatus.   Plan for discharge tomorrow   LOS: 2 days   Renne Muscaaniel L Faris Coolman, MD PGY-1 Center for [facility], [facility]  09/14/2016, 9:26 AM

## 2016-09-15 ENCOUNTER — Telehealth: Payer: Self-pay

## 2016-09-15 MED ORDER — DOCUSATE SODIUM 100 MG PO CAPS: 100.0000 mg | ORAL_CAPSULE | Freq: Two times a day (BID) | ORAL | 0 refills | Status: AC

## 2016-09-15 MED ORDER — IBUPROFEN 600 MG PO TABS
600.0000 mg | ORAL_TABLET | Freq: Four times a day (QID) | ORAL | 0 refills | Status: AC | PRN
Start: 2016-09-15 — End: ?

## 2016-09-15 MED ORDER — OXYCODONE-ACETAMINOPHEN 5-325 MG PO TABS: 1.0000 | ORAL_TABLET | ORAL | 0 refills | Status: AC | PRN

## 2016-09-15 NOTE — Discharge Summary (Signed)
OB Discharge Summary     Patient Name: Cathy [name] DOB: 06/26/1986 MRN: [id number]  Date of admission: 09/12/2016 Delivering MD:    Candise BowensSnipes, PendingBaby [[id number]][[id number]]      Mcginness, Boy Takeisha [[id number]][[id number]]  Elsie LincolnLEGGETT, KELLY H   Date of discharge: 09/15/2016  Admitting diagnosis: C-SECTION RCS [phone] Intrauterine pregnancy: 5879w4d     Secondary diagnosis:  Active Problems:   Cesarean delivery delivered  Additional problems: none     Discharge diagnosis: Term Pregnancy Delivered                                                                                                Post partum procedures:none  Augmentation: AROM  Complications: None  Hospital course:  Sceduled C/S   31 y.o. yo [id number] at 3379w4d was admitted to the hospital 09/12/2016 for scheduled cesarean section with the following indication:Elective Repeat.  Membrane Rupture Time/Date:   Patient delivered a Viable infant.  Details of operation can be found in separate operative note.  Pateint had an uncomplicated postpartum course.  She is ambulating, tolerating a regular diet, passing flatus, and urinating well. Patient is discharged home in stable condition on  09/15/16         Physical exam  Vitals:   09/13/16 1840 09/14/16 0642 09/14/16 1756 09/15/16 0526  BP: (!) 116/55 (!) 115/58 100/72 111/63  Pulse: 64 (!) 56 72 79  Resp: 18 16 18 18   Temp: 98.1 F (36.7 C) 97.6 F (36.4 C) 98.6 F (37 C) 98.2 F (36.8 C)  TempSrc: Oral Oral Oral Oral  SpO2:      Weight:      Height:       General: alert and cooperative Lochia: appropriate Uterine Fundus: firm Incision: Healing well with no significant drainage, No significant erythema, Dressing is clean, dry, and intact DVT Evaluation: No evidence of DVT seen on physical exam. Labs: Lab Results  Component Value Date   WBC 9.5 09/13/2016   HGB 10.4 (L) 09/13/2016   HCT 30.8 (L) 09/13/2016   MCV 87.7 09/13/2016   PLT 272 09/13/2016   CMP Latest Ref Rng  & Units 02/01/2016  Glucose 65 - 99 mg/dL 010(U108(H)  BUN 6 - 20 mg/dL 7  Creatinine 7.250.44 - 3.661.00 mg/dL 4.400.58  Sodium 347135 - 425145 mmol/L 134(L)  Potassium 3.5 - 5.1 mmol/L 3.9  Chloride 101 - 111 mmol/L 107  CO2 22 - 32 mmol/L 23  Calcium 8.9 - 10.3 mg/dL 9.5(G8.7(L)  Total Protein 6.5 - 8.1 g/dL 6.5  Total Bilirubin 0.3 - 1.2 mg/dL 0.7  Alkaline Phos 38 - 126 U/L 35(L)  AST 15 - 41 U/L 14(L)  ALT 14 - 54 U/L 14    Discharge instruction: per After Visit Summary and "Baby and Me Booklet".  After visit meds:  Allergies as of 09/15/2016      Reactions   Lactose Intolerance (gi) Diarrhea   Penicillins Other (See Comments)   Reaction:  Unknown  Has patient had a PCN reaction causing immediate rash, facial/tongue/throat swelling, SOB or lightheadedness with hypotension: Unsure Has patient had a PCN  reaction causing severe rash involving mucus membranes or skin necrosis: Unsure Has patient had a PCN reaction that required hospitalization Unsure Has patient had a PCN reaction occurring within the last 10 years: No If all of the above answers are "NO", then may proceed with Cephalosporin use.   Latex Itching, Swelling, Rash, Other (See Comments)   Reaction:  Localized swelling       Medication List    STOP taking these medications   PRENATE PIXIE 10-0.6-0.4-200 MG Caps     TAKE these medications   docusate sodium 100 MG capsule Commonly known as:  COLACE Take 1 capsule (100 mg total) by mouth 2 (two) times daily.   ibuprofen 600 MG tablet Commonly known as:  ADVIL,MOTRIN Take 1 tablet (600 mg total) by mouth every 6 (six) hours as needed for mild pain or moderate pain.   oxyCODONE-acetaminophen 5-325 MG tablet Commonly known as:  PERCOCET Take 1-2 tablets by mouth every 4 (four) hours as needed for severe pain.   ROLAIDS ADVANCED 1000-200-40 MG Chew Generic drug:  Cal Carb-Mag Hydrox-Simeth Chew 1-2 each by mouth as needed (for indigestion).       Diet: routine diet  Activity:  Advance as tolerated. Pelvic rest for 6 weeks.   Outpatient follow up:6 weeks Follow up Appt:Future Appointments Date Time Provider Department Center  10/18/2016 1:20 PM Judeth Horn, NP [facility]   Follow up Visit:No Follow-up on file.  Postpartum contraception: IUD Mirena  Newborn Data:    Wilbert, Schouten [[id number]]  Live born female  Birth Weight: 7 lb 8.1 oz (3405 g) APGAR: 8, 9  Baby Feeding: Breast Disposition:home with mother   09/15/2016 Renne Musca, MD  OB FELLOW DISCHARGE ATTESTATION  I have seen and examined this patient and agree with above documentation in the resident's note.   Jen Mow, DO OB Fellow 9:53 AM   Attestation of Attending Supervision of Fellow: Evaluation and management procedures were performed by the fellow under my supervision.  I have reviewed the fellow's note and chart, and I agree with the management and plan.   Cornelia Copa MD Attending Center for [employer] [facility])

## 2016-09-15 NOTE — Progress Notes (Cosign Needed)
POSTPARTUM PROGRESS NOTE  Post operative Day 3 Subjective:  Cathy [name] is a 31 y.o. [id number] 5147w4d s/p  RTLCS with vertical abdominal skin incision.  No acute events overnight.  Pt denies problems with ambulating, voiding or po intake.  She denies nausea or vomiting.  Pain is moderately controlled.  She has had flatus. She has not had bowel movement.  Lochia is appropriate.   Objective: Blood pressure 111/63, pulse 79, temperature 98.2 F (36.8 C), temperature source Oral, resp. rate 18, height 5' (1.524 m), weight 111.1 kg (245 lb), last menstrual period 12/13/2015, SpO2 98 %.  Physical Exam:  General: alert, cooperative and no distress Lochia:normal flow Chest: CTAB, no wheezing, rales, or rhonchi, normal work of breathing Heart: RRR no m/r/g, normal s1 & s2 Abdomen: +BS, soft, nontender  Uterine Fundus: firm,  DVT Evaluation: No calf swelling or tenderness Extremities:no edema, +2 dorsalis pedis pulses bilaterally   Recent Labs  09/13/16 0526  HGB 10.4*  HCT 30.8*    Assessment/Plan:  ASSESSMENT: Cathy [name] is a 31 y.o. [id number] 4147w4d s/p RTLCS  Discharge home, Breastfeeding and Contraception Mirena IUD; circumcision was declined   LOS: 3 days   Collie Siadgar Nancey Kreitz, Medical Student MS3 Center for [facility], [facility]  09/15/2016, 9:11 AM

## 2016-09-15 NOTE — Lactation Note (Signed)
This note was copied from a baby's chart. Lactation Consultation Note  Patient Name: Cathy Ruben Gottroniffany Pembleton WUJWJ'XToday's Date: 09/15/2016 Reason for consult: Follow-up assessment Mom reports baby is nursing well, denies questions/concerns. Engorgement care reviewed if needed, advised of OP services and support group. Encouraged to call for questions/concerns.   Maternal Data    Feeding Length of feed: 15 min  LATCH Score/Interventions Latch: Grasps breast easily, tongue down, lips flanged, rhythmical sucking.  Audible Swallowing: Spontaneous and intermittent  Type of Nipple: Everted at rest and after stimulation  Comfort (Breast/Nipple): Filling, red/small blisters or bruises, mild/mod discomfort Problem noted: Cracked, bleeding, blisters, bruises Intervention(s):  (Expressed breastmilk, coconut oil)     Hold (Positioning): Assistance needed to correctly position infant at breast and maintain latch.  LATCH Score: 8  Lactation Tools Discussed/Used     Consult Status Consult Status: Complete Date: 09/15/16 Follow-up type: In-patient    Alfred LevinsGranger, Cathy [name] Ann 09/15/2016, 11:34 AM

## 2016-09-15 NOTE — Discharge Instructions (Signed)

## 2016-09-15 NOTE — Telephone Encounter (Signed)
This patient called this am. She stated the doctor who discharged her was going to call in something for her fever blister. There is nothing in the chart related to her having a fever blister. Do you want to call in something?

## 2016-09-16 ENCOUNTER — Other Ambulatory Visit: Payer: Self-pay | Admitting: Family Medicine

## 2016-09-16 MED ORDER — ACYCLOVIR 5 % EX OINT: TOPICAL_OINTMENT | Freq: Every day | CUTANEOUS | Status: AC

## 2016-09-16 NOTE — Telephone Encounter (Signed)
I have sent a topical prescription for her oral herpes outbreak. Please inform patient. Thank you.

## 2016-09-23 ENCOUNTER — Ambulatory Visit: Payer: Medicaid Other

## 2016-09-24 ENCOUNTER — Telehealth: Payer: Self-pay | Admitting: Obstetrics & Gynecology

## 2016-09-24 NOTE — Telephone Encounter (Signed)
Person answering phone stated she would have patient call the office. She stated she was at the store.

## 2016-09-24 NOTE — Telephone Encounter (Signed)
Patient was scheduled for 02/22. She did not make this appointment. I called, but got no answer.

## 2016-09-28 ENCOUNTER — Telehealth: Payer: Self-pay | Admitting: Obstetrics & Gynecology

## 2016-09-28 NOTE — Telephone Encounter (Signed)
Called patient to see if she would come to be seen. She has not made her appointment to get her stables removed. Also called the [phone], and the mailbox was full. I will send certified letter.

## 2016-09-29 ENCOUNTER — Ambulatory Visit: Payer: Medicaid Other

## 2016-09-29 ENCOUNTER — Ambulatory Visit: Payer: Medicaid Other | Admitting: *Deleted

## 2016-09-29 DIAGNOSIS — Z4802 Encounter for removal of sutures: Secondary | ICD-10-CM

## 2016-09-29 NOTE — Progress Notes (Signed)
Pt here for staple removal as scheduled. Incision assessed and found to be intact, well-healed and with no bleeding or drainage. Staples removed without incident. Instructions for cleaning care given. Post partum appt is scheduled on 3/19. Pt voiced understanding of all information and instructions given.

## 2016-10-07 ENCOUNTER — Telehealth: Payer: Self-pay | Admitting: *Deleted

## 2016-10-07 DIAGNOSIS — B3789 Other sites of candidiasis: Secondary | ICD-10-CM

## 2016-10-07 DIAGNOSIS — O9102 Infection of nipple associated with the puerperium: Principal | ICD-10-CM

## 2016-10-07 MED ORDER — FLUCONAZOLE 100 MG PO TABS: ORAL_TABLET | ORAL | 0 refills | Status: AC

## 2016-10-07 NOTE — Telephone Encounter (Signed)
Pt requesting rx for thrush on her breast. Baby was diagnosed and pediatrician told her to get rx from us to treat her breast.

## 2016-10-18 ENCOUNTER — Ambulatory Visit: Payer: Medicaid Other | Admitting: Student

## 2016-10-18 ENCOUNTER — Ambulatory Visit: Payer: Medicaid Other | Admitting: Family Medicine

## 2017-01-07 NOTE — Addendum Note (Signed)
Addendum  created 01/07/17 1001 by Shatonia Hoots, MD   Sign clinical note    

## 2017-03-24 ENCOUNTER — Encounter (HOSPITAL_COMMUNITY): Payer: Self-pay

## 2018-04-09 IMAGING — US US MFM OB DETAIL+14 WK
1 series · 14 of 28 positions shown · non-contrast
Comparison: none

[Series 1: us mfm ob detail+14 wk · 97 acquisitions, 14 frames shown]
[im 4/97]
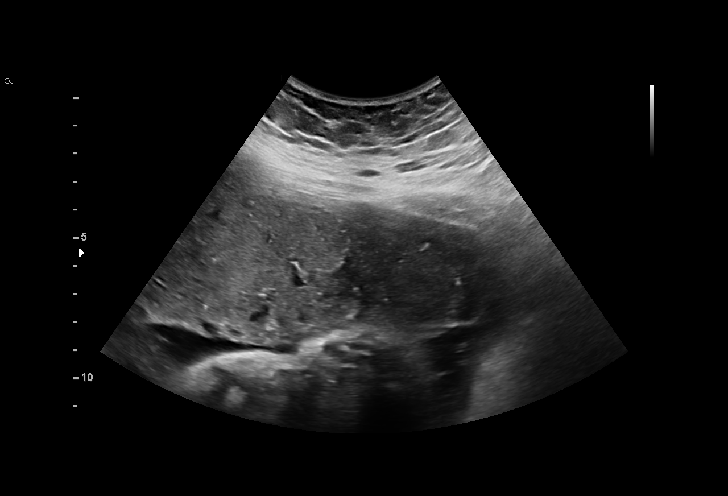
[im 11/97]
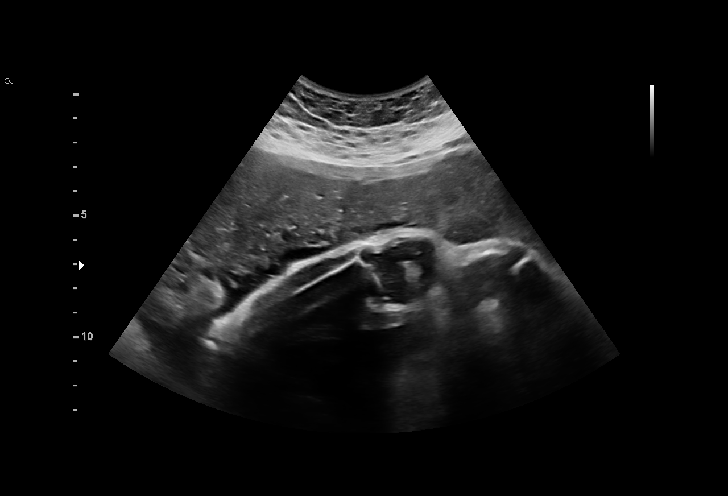
[im 18/97]
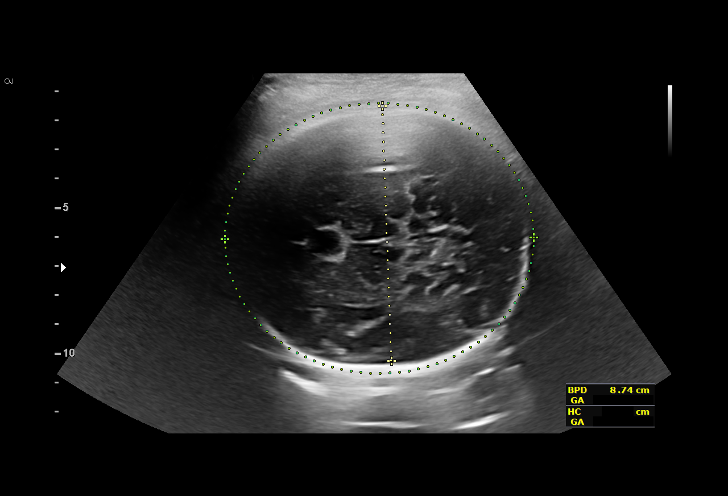
[im 25/97]
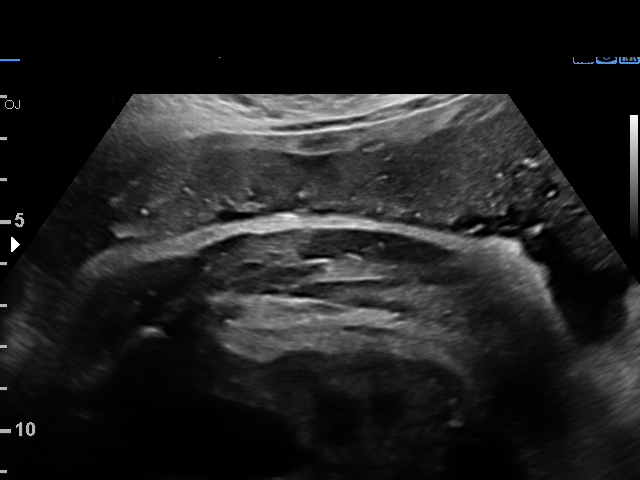
[im 33/97]
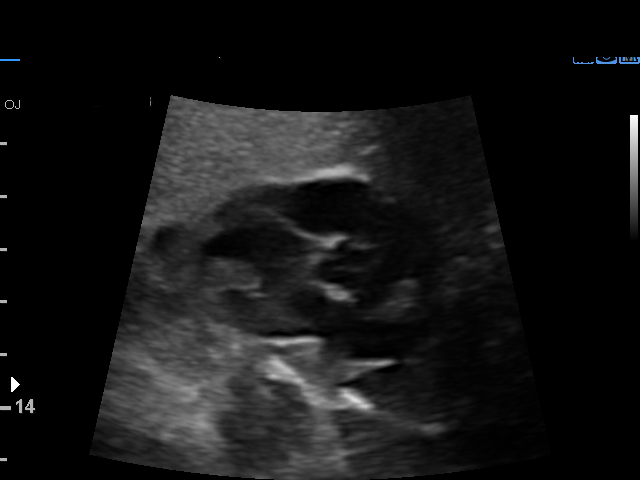
[im 40/97]
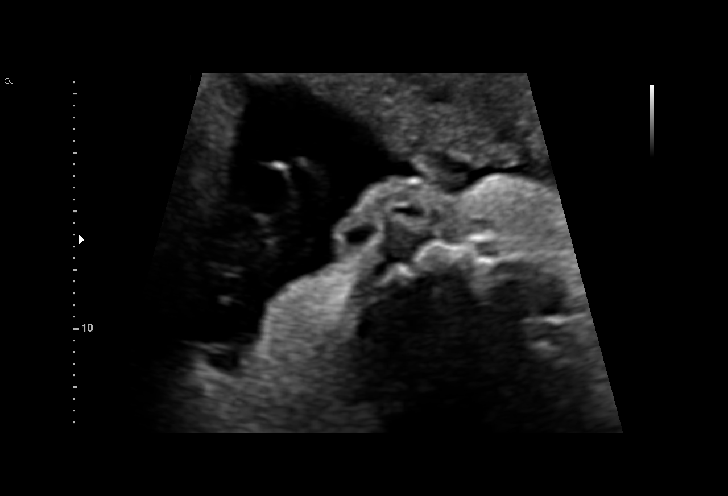
[im 47/97]
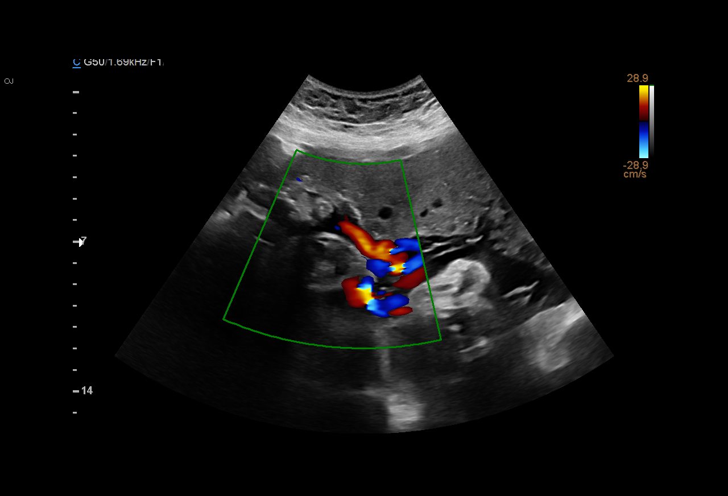
[im 54/97]
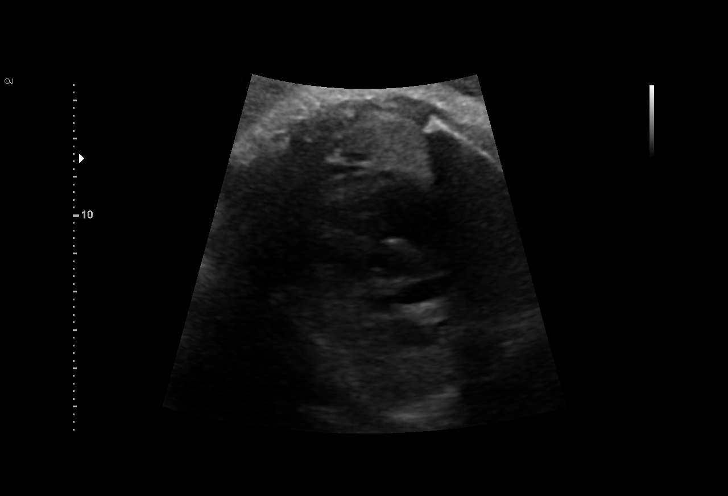
[im 61/97]
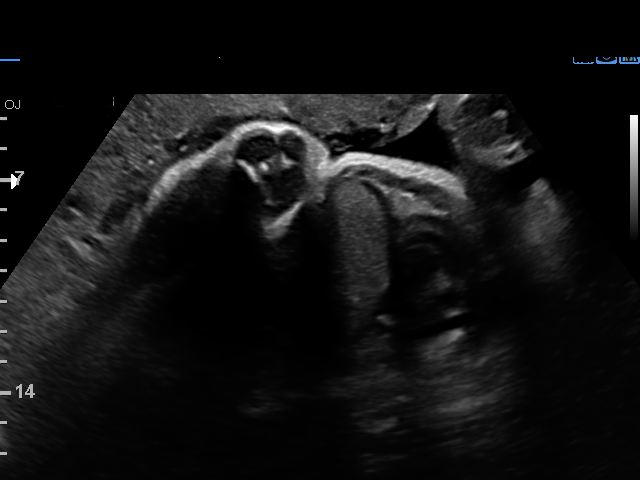
[im 68/97]
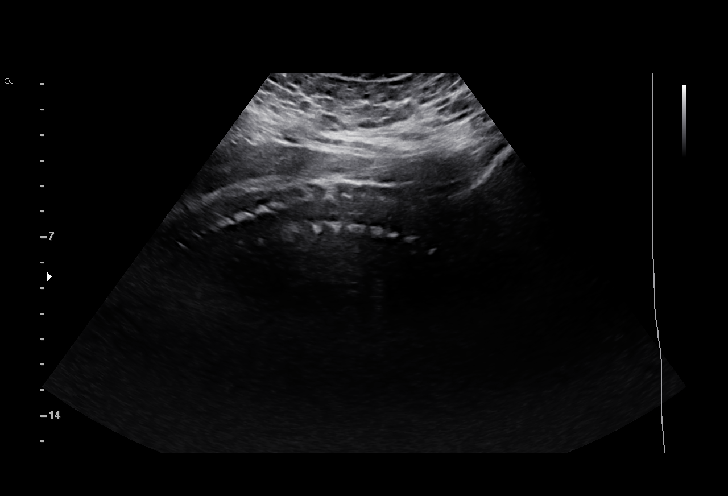
[im 75/97]
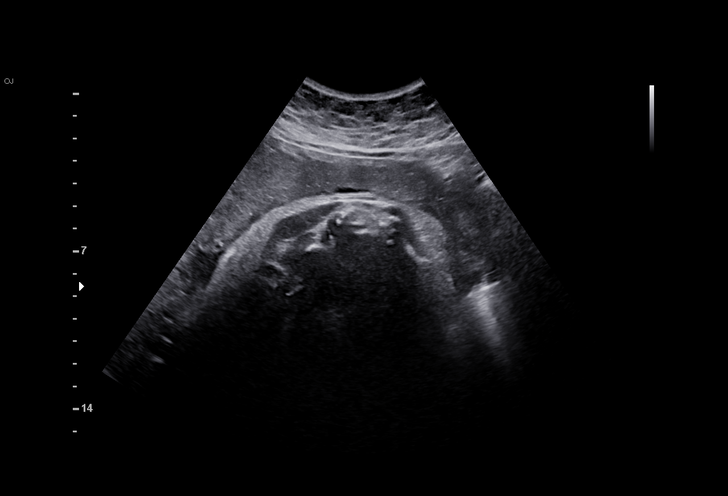
[im 82/97]
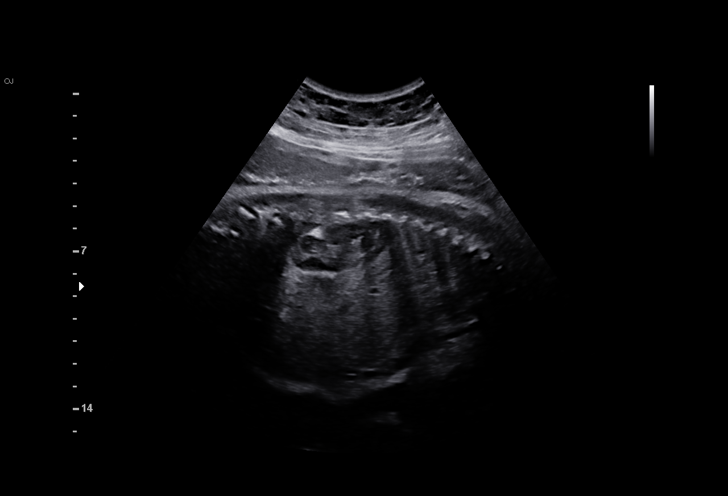
[im 89/97]
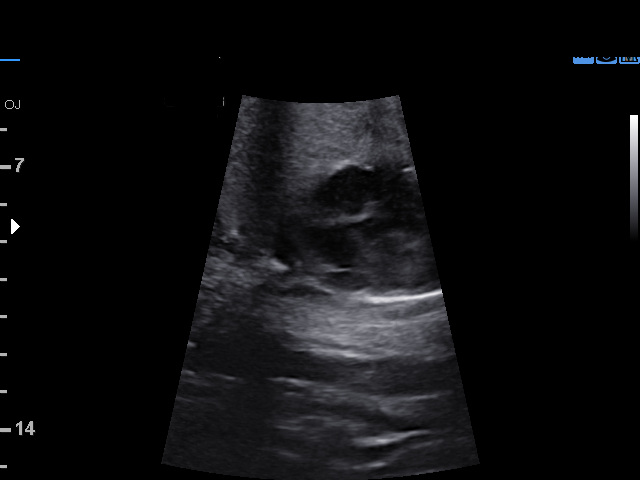
[im 97/97]
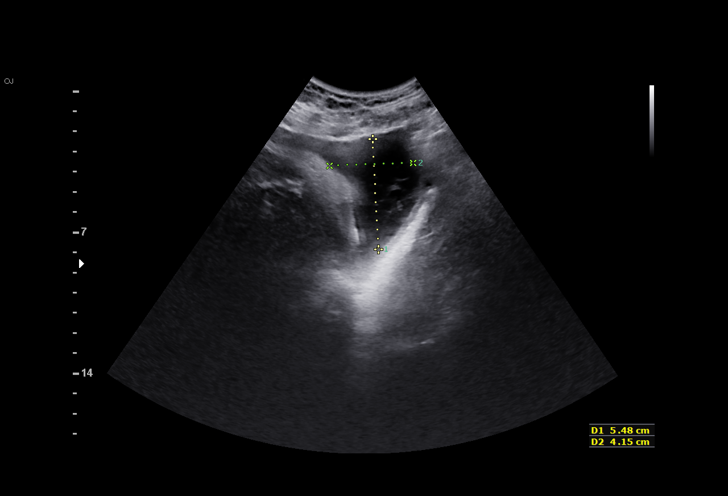

[14 of 28 positions shown; findings below may reference images not displayed]

OB/Gyn Clinic

Indications

36 weeks gestation of pregnancy
Encounter for antenatal screening for
malformations
Previous cesarean delivery x 5, antepartum
Poor obstetric history: Previous IUFD
(stillbirth) -due to placental abruption;
preterm twin at 31 weeks
Previous pregnancy complicated by
chromosomal abnormality (T21 first
pregnancy, antepartum)
Smoking complicating pregnancy, third
trimester
OB History

Blood Type:            Height:  5'0"   Weight (lb):  245      BMI:
Gravidity:    7         Term:   3        Prem:   2        SAB:   1
Living:       5
Fetal Evaluation

Num Of Fetuses:     1
Fetal Heart         136
Rate(bpm):
Cardiac Activity:   Observed
Presentation:       Cephalic
Placenta:           Anterior, above cervical os
P. Cord Insertion:  Visualized

Amniotic Fluid
AFI FV:      Subjectively low-normal

AFI Sum(cm)     %Tile       Largest Pocket(cm)
5.69            < 3

RUQ(cm)                     LUQ(cm)
4.53
Biophysical Evaluation

Amniotic F.V:   Pocket => 2 cm two         F. Tone:        Observed
planes
F. Movement:    Observed                   Score:          [DATE]
F. Breathing:   Observed
Biometry

BPD:      87.9  mm     G. Age:  35w 4d         29  %    CI:        75.79   %   70 - 86
FL/HC:      21.4   %   20.8 -
HC:      320.1  mm     G. Age:  36w 1d         13  %    HC/AC:      0.95       0.92 -
AC:      337.6  mm     G. Age:  37w 5d         84  %    FL/BPD:     78.0   %   71 - 87
FL:       68.6  mm     G. Age:  35w 1d         15  %    FL/AC:      20.3   %   20 - 24
HUM:      64.3  mm     G. Age:  37w 2d         79  %

Est. FW:    0996  gm    6 lb 10 oz      66  %
Gestational Age

LMP:           36w 5d       Date:   12/13/15                 EDD:   09/18/16
U/S Today:     36w 1d                                        EDD:   09/22/16
Best:          36w 5d    Det. By:   LMP  (12/13/15)          EDD:   09/18/16
Anatomy

Cranium:               Appears normal         Aortic Arch:            Appears normal
Cavum:                 Appears normal         Ductal Arch:            Appears normal
Ventricles:            Appears normal         Diaphragm:              Appears normal
Choroid Plexus:        Appears normal         Stomach:                Appears normal, left
sided
Cerebellum:            Appears normal         Abdomen:                Appears normal
Posterior Fossa:       Appears normal         Abdominal Wall:         Not well visualized
Nuchal Fold:           Not applicable (>20    Cord Vessels:           Appears normal (3
wks GA)                                        vessel cord)
Face:                  Orbits nl; profile not Kidneys:                Appear normal
well visualized
Lips:                  Appears normal         Bladder:                Appears normal
Thoracic:              Appears normal         Spine:                  Appears normal
Heart:                 Appears normal         Upper Extremities:      Visualized
(4CH, axis, and
situs)
RVOT:                  Appears normal         Lower Extremities:      Visualized
LVOT:                  Appears normal

Other:  Male gender. Technically difficult due to advanced GA and fetal
position.
Cervix Uterus Adnexa

Cervix
Not visualized (advanced GA >86wks)

Uterus
No abnormality visualized.

Left Ovary
Not visualized. No adnexal mass visualized.

Right Ovary
Not visualized. No adnexal mass visualized.
Impression

SIUP at 36+5 weeks
Cephalic presentation
Normal detailed fetal anatomy; limited views of profile and CI
Low normal amniotic fluid volume (I measured a 5.4 x
cms pocket)
Measurements consistent with LMP dating with EFW at the
66th %tile
BPP [DATE]
Recommendations

Continue twice weekly NSTs with weekly AFIs

## 2019-02-06 ENCOUNTER — Encounter: Payer: Self-pay | Admitting: *Deleted

## 2020-03-29 ENCOUNTER — Ambulatory Visit (HOSPITAL_COMMUNITY)
Admission: EM | Admit: 2020-03-29 | Discharge: 2020-03-29 | Disposition: A | Discharge status: Home / Self Care | Payer: Self-pay

## 2020-03-29 ENCOUNTER — Emergency Department (HOSPITAL_COMMUNITY): Admission: EM | Admit: 2020-03-29 | Discharge: 2020-03-29 | Discharge status: Absent without leave | Payer: Self-pay

## 2020-03-29 NOTE — ED Notes (Signed)
Pt left stickers on desk and stated she is leaving

## 2020-04-01 ENCOUNTER — Other Ambulatory Visit: Payer: Self-pay

## 2020-04-01 ENCOUNTER — Encounter (HOSPITAL_COMMUNITY): Payer: Self-pay | Admitting: Emergency Medicine

## 2020-04-01 ENCOUNTER — Emergency Department (HOSPITAL_COMMUNITY)
Admission: EM | Admit: 2020-04-01 | Discharge: 2020-04-01 | Disposition: A | Discharge status: Home / Self Care | Payer: Medicaid Other | Attending: Emergency Medicine | Admitting: Emergency Medicine

## 2020-04-01 ENCOUNTER — Emergency Department (HOSPITAL_COMMUNITY): Payer: Medicaid Other

## 2020-04-01 DIAGNOSIS — F17210 Nicotine dependence, cigarettes, uncomplicated: Secondary | ICD-10-CM | POA: Insufficient documentation

## 2020-04-01 DIAGNOSIS — M25512 Pain in left shoulder: Secondary | ICD-10-CM | POA: Diagnosis not present

## 2020-04-01 DIAGNOSIS — Z91040 Latex allergy status: Secondary | ICD-10-CM | POA: Insufficient documentation

## 2020-04-01 DIAGNOSIS — R600 Localized edema: Secondary | ICD-10-CM | POA: Insufficient documentation

## 2020-04-01 DIAGNOSIS — Z79899 Other long term (current) drug therapy: Secondary | ICD-10-CM | POA: Diagnosis not present

## 2020-04-01 DIAGNOSIS — M25562 Pain in left knee: Secondary | ICD-10-CM | POA: Insufficient documentation

## 2020-04-01 DIAGNOSIS — H538 Other visual disturbances: Secondary | ICD-10-CM | POA: Diagnosis not present

## 2020-04-01 LAB — CBC WITH DIFFERENTIAL/PLATELET
Abs Immature Granulocytes: 0.02 10*3/uL (ref 0.00–0.07)
Basophils Absolute: 0 10*3/uL (ref 0.0–0.1)
Basophils Relative: 0 %
Eosinophils Absolute: 0.3 10*3/uL (ref 0.0–0.5)
Eosinophils Relative: 4 %
HCT: 37 % (ref 36.0–46.0)
Hemoglobin: 11.9 g/dL — ABNORMAL LOW (ref 12.0–15.0)
Immature Granulocytes: 0 %
Lymphocytes Relative: 43 %
Lymphs Abs: 3 10*3/uL (ref 0.7–4.0)
MCH: 31.1 pg (ref 26.0–34.0)
MCHC: 32.2 g/dL (ref 30.0–36.0)
MCV: 96.6 fL (ref 80.0–100.0)
Monocytes Absolute: 0.5 10*3/uL (ref 0.1–1.0)
Monocytes Relative: 7 %
Neutro Abs: 3.1 10*3/uL (ref 1.7–7.7)
Neutrophils Relative %: 46 %
Platelets: 316 10*3/uL (ref 150–400)
RBC: 3.83 MIL/uL — ABNORMAL LOW (ref 3.87–5.11)
RDW: 12.6 % (ref 11.5–15.5)
WBC: 6.9 10*3/uL (ref 4.0–10.5)
nRBC: 0 % (ref 0.0–0.2)

## 2020-04-01 LAB — COMPREHENSIVE METABOLIC PANEL
ALT: 17 U/L (ref 0–44)
AST: 15 U/L (ref 15–41)
Albumin: 3.9 g/dL (ref 3.5–5.0)
Alkaline Phosphatase: 37 U/L — ABNORMAL LOW (ref 38–126)
Anion gap: 8 (ref 5–15)
BUN: 8 mg/dL (ref 6–20)
CO2: 26 mmol/L (ref 22–32)
Calcium: 9.2 mg/dL (ref 8.9–10.3)
Chloride: 103 mmol/L (ref 98–111)
Creatinine, Ser: 0.65 mg/dL (ref 0.44–1.00)
GFR calc Af Amer: >60 mL/min (ref >60)
GFR calc non Af Amer: >60 mL/min (ref >60)
Glucose, Bld: 92 mg/dL (ref 70–99)
Potassium: 3.8 mmol/L (ref 3.5–5.1)
Sodium: 137 mmol/L (ref 135–145)
Total Bilirubin: 0.4 mg/dL (ref 0.3–1.2)
Total Protein: 7 g/dL (ref 6.5–8.1)

## 2020-04-01 NOTE — ED Notes (Signed)
Visual acuity screening completed; pt has bilateral 20/20 vision, does not wear glasses or contacts.

## 2020-04-01 NOTE — ED Triage Notes (Signed)
Patient states facial swelling and problems with her vision that occurred x 2 days ago. Patient also states pain to her left ankle but denies any injury. Patient states that face also broke out 2 days ago.

## 2020-04-01 NOTE — Discharge Instructions (Addendum)
Please call Dr. Ronal Fear office to have him evaluate your change of eyesight in your right eye.  He may want to do further testing such as a MRI of your brain.  You can take acetaminophen or ibuprofen for your pain in your shoulder.

## 2020-04-01 NOTE — ED Provider Notes (Signed)
[facility] EMERGENCY DEPARTMENT Provider Note   CSN: [id number] Arrival date & time: 04/01/20  0025   Time seen 5:10 AM  History Chief Complaint  Patient presents with  . multiple complaints    Cathy [name] is a 34 y.o. female.  HPI Patient states she had a rash on her face started August 25.  She states it was itchy and she was taken Benadryl for it.  She states it is almost gone now.  She states on Friday, August 27 she woke up and she "could not see" out of her right eye.  On further questioning she states it was blurred.  She states later on she had difficulty seeing things on the outer side on the right.  She states she went to urgent care and was told she needed to go to the ED however she went to work instead.  She states she started having pain in her left shoulder on the 28th.  And later on she started having pain in her left knee and she has been noticing her left ankle swells and it comes and goes.  She denies any numbness or weakness in her extremities.  She states she can walk and use her left arm normally.  Patient is right-handed.  She denies being on any birth control or hormones.  She has not been traveling.  Family history she is unaware of anyone who has had a stroke.  Her husband is in the ED tonight for an acute problem.  PCP Patient, No Pcp Per     Past Medical History:  Diagnosis Date  . Vaginal Pap smear, abnormal     Patient Active Problem List   Diagnosis Date Noted  . Cesarean delivery delivered 09/13/2016  . [redacted] weeks gestation of pregnancy   . Supervision of high-risk pregnancy 08-09-16  . History of intrauterine fetal death, currently pregnant Aug 09, 2016  . Late prenatal care 08-09-2016  . Previous cesarean delivery x 5, antepartum 06/26/2014  . 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 CESAREAN SECTION     x4  . CESAREAN SECTION N/A 07/29/2014   Procedure: CESAREAN SECTION;   Surgeon: Willodean Rosenthal, MD;  Location: [facility] ORS;  Service: Obstetrics;  Laterality: N/A;  . CESAREAN SECTION N/A 09/12/2016   Procedure: CESAREAN SECTION;  Surgeon: Lesly Dukes, MD;  Location: [facility] BIRTHING SUITES;  Service: Obstetrics;  Laterality: N/A;  . DILATION AND CURETTAGE OF UTERUS    . OVARIAN CYST REMOVAL       OB History    Gravida  7   Para  5   Term  3   Preterm  2   AB  1   Living  5     SAB  1   TAB      Ectopic      Multiple  1   Live Births  5           Family History  Problem Relation Age of Onset  . Diabetes Maternal Grandmother   . Hypertension Maternal Grandmother   . Hypertension Maternal Grandfather   . Diabetes Paternal Grandmother   . Hypertension Paternal Grandmother   . Hypertension Paternal Grandfather     Social History   Tobacco Use  . Smoking status: Current Some Day Smoker    Packs/day: 0.25    Types: Cigarettes  . Smokeless tobacco: Never Used  Substance Use Topics  . Alcohol use: No  .  Drug use: No  employed  Home Medications Prior to Admission medications   Medication Sig Start Date End Date Taking? Authorizing Provider  Cal Carb-Mag Hydrox-Simeth (ROLAIDS ADVANCED) 1000-200-40 MG CHEW Chew 1-2 each by mouth as needed (for indigestion).     [provider]  docusate sodium (COLACE) 100 MG capsule Take 1 capsule (100 mg total) by mouth 2 (two) times daily. 09/15/16   Renne Musca, MD  fluconazole (DIFLUCAN) 100 MG tablet Take 2 tablets (200 mg on day 1) and then on day 2-13 take 100 mg daily. 10/07/16   Adam Phenix, MD  ibuprofen (ADVIL,MOTRIN) 600 MG tablet Take 1 tablet (600 mg total) by mouth every 6 (six) hours as needed for mild pain or moderate pain. 09/15/16   Renne Musca, MD  oxyCODONE-acetaminophen (PERCOCET) 5-325 MG tablet Take 1-2 tablets by mouth every 4 (four) hours as needed for severe pain. 09/15/16   Renne Musca, MD    Allergies    Lactose intolerance (gi),  Penicillins, and Latex  Review of Systems   Review of Systems  All other systems reviewed and are negative.   Physical Exam Updated Vital Signs BP 109/62 (BP Location: Right Arm)   Pulse 61   Temp 99 F (37.2 C) (Oral)   Resp 18   Ht 5\' 1"  (1.549 m)   Wt 111.1 kg   LMP 03/28/2020   SpO2 100%   BMI 46.29 kg/m   Physical Exam Vitals and nursing note reviewed.  Constitutional:      General: She is not in acute distress.    Appearance: Normal appearance. She is obese.  HENT:     Head: Normocephalic and atraumatic.     Right Ear: External ear normal.     Left Ear: External ear normal.     Mouth/Throat:     Mouth: Mucous membranes are moist.     Pharynx: No oropharyngeal exudate or posterior oropharyngeal erythema.  Eyes:     Extraocular Movements: Extraocular movements intact.     Conjunctiva/sclera: Conjunctivae normal.     Pupils: Pupils are equal, round, and reactive to light.     Comments: When I do her visual fields I do not detect any visual field deficits.  Cardiovascular:     Rate and Rhythm: Normal rate and regular rhythm.     Pulses: Normal pulses.     Heart sounds: Normal heart sounds.  Pulmonary:     Effort: Pulmonary effort is normal. No respiratory distress.     Breath sounds: Normal breath sounds.  Musculoskeletal:        General: No deformity.     Cervical back: Normal range of motion.  Skin:    General: Skin is warm and dry.  Neurological:     General: No focal deficit present.     Mental Status: She is alert and oriented to person, place, and time. Mental status is at baseline.     Cranial Nerves: No cranial nerve deficit.     Comments: Patient's grips are equal, there is no pronator drift, there is no weakness of her lower extremities.  Please note she did not display any difficulty pointing her left arm to the ceiling or flexing and straightening her left knee.  Psychiatric:        Mood and Affect: Mood normal.        Behavior: Behavior normal.         Thought Content: Thought content normal.  Visual Acuity 05:34:07 ED Notes RF  Visual acuity screening completed; pt has bilateral 20/20 vision, does not wear glasses or contacts.     ED Results / Procedures / Treatments   Labs (all labs ordered are listed, but only abnormal results are displayed) Results for orders placed or performed during the hospital encounter of 04/01/20  Comprehensive metabolic panel  Result Value Ref Range   Sodium 137 135 - 145 mmol/L   Potassium 3.8 3.5 - 5.1 mmol/L   Chloride 103 98 - 111 mmol/L   CO2 26 22 - 32 mmol/L   Glucose, Bld 92 70 - 99 mg/dL   BUN 8 6 - 20 mg/dL   Creatinine, Ser 7.37 0.44 - 1.00 mg/dL   Calcium 9.2 8.9 - 10.6 mg/dL   Total Protein 7.0 6.5 - 8.1 g/dL   Albumin 3.9 3.5 - 5.0 g/dL   AST 15 15 - 41 U/L   ALT 17 0 - 44 U/L   Alkaline Phosphatase 37 (L) 38 - 126 U/L   Total Bilirubin 0.4 0.3 - 1.2 mg/dL   GFR calc non Af Amer >60 >60 mL/min   GFR calc Af Amer >60 >60 mL/min   Anion gap 8 5 - 15  CBC with Differential  Result Value Ref Range   WBC 6.9 4.0 - 10.5 K/uL   RBC 3.83 (L) 3.87 - 5.11 MIL/uL   Hemoglobin 11.9 (L) 12.0 - 15.0 g/dL   HCT 26.9 36 - 46 %   MCV 96.6 80.0 - 100.0 fL   MCH 31.1 26.0 - 34.0 pg   MCHC 32.2 30.0 - 36.0 g/dL   RDW 48.5 46.2 - 70.3 %   Platelets 316 150 - 400 K/uL   nRBC 0.0 0.0 - 0.2 %   Neutrophils Relative % 46 %   Neutro Abs 3.1 1.7 - 7.7 K/uL   Lymphocytes Relative 43 %   Lymphs Abs 3.0 0.7 - 4.0 K/uL   Monocytes Relative 7 %   Monocytes Absolute 0.5 0 - 1 K/uL   Eosinophils Relative 4 %   Eosinophils Absolute 0.3 0 - 0 K/uL   Basophils Relative 0 %   Basophils Absolute 0.0 0 - 0 K/uL   Immature Granulocytes 0 %   Abs Immature Granulocytes 0.02 0.00 - 0.07 K/uL   Laboratory interpretation all normal except mild anemia    EKG None  Radiology CT Head Wo Contrast  Result Date: 04/01/2020 CLINICAL DATA:  Monocular vision loss. Facial swelling. Symptoms began 2  days ago EXAM: CT HEAD WITHOUT CONTRAST TECHNIQUE: Contiguous axial images were obtained from the base of the skull through the vertex without intravenous contrast. COMPARISON:  None. FINDINGS: Brain: No evidence of acute infarction, hemorrhage, hydrocephalus, extra-axial collection or mass lesion/mass effect. Vascular: No hyperdense vessel or unexpected calcification. Skull: Normal. Negative for fracture or focal lesion. Sinuses/Orbits: Covered orbits show no mass or visible inflammation. No superior ophthalmic vein distension or asymmetry. No covered sinusitis. IMPRESSION: Negative head CT. Electronically Signed   By: Marnee Spring M.D.   On: 04/01/2020 06:40    Procedures Procedures (including critical care time)  Medications Ordered in ED Medications - No data to display  ED Course  I have reviewed the triage vital signs and the nursing notes.  Pertinent labs & imaging results that were available during my care of the patient were reviewed by me and considered in my medical decision making (see chart for details).    MDM Rules/Calculators/A&P  Patient's vision is 20/20 and she has no visual field cuts that I can demonstrate.  At this point I think patient can be evaluated as an outpatient by neurology.  Her head CT does not show anything acute.  The loss of eyesight in her right eye is worrisome but could be a early warning sign of MS which can be determined by neurology.      Final Clinical Impression(s) / ED Diagnoses Final diagnoses:  Blurred vision, right eye    Rx / DC Orders ED Discharge Orders    None    OTC ibuprofen and acetaminophen  Plan discharge  Devoria AlbeIva Marisel Tostenson, MD, [facility] PyoFACEP    Melissaann Dizdarevic, MD 04/01/20 [phone]

## 2022-07-14 ENCOUNTER — Ambulatory Visit (LOCAL_COMMUNITY_HEALTH_CENTER): Payer: Medicaid Other

## 2022-07-14 VITALS — BP 117/67 | Ht 61.0 in | Wt 264.5 lb

## 2022-07-14 DIAGNOSIS — Z3201 Encounter for pregnancy test, result positive: Secondary | ICD-10-CM

## 2022-07-14 LAB — PREGNANCY, URINE: Preg Test, Ur: POSITIVE — AB

## 2022-07-14 MED ORDER — PRENATAL 27-0.8 MG PO TABS: 1.0000 | ORAL_TABLET | Freq: Every day | ORAL | 0 refills | Status: AC

## 2022-07-14 NOTE — Progress Notes (Signed)
UPT positive. Planning prenatal care at [facility]. Past prenatal care at [facility].  Pt explains she has more anxiety r/t this preg d/t Hx high risk pregnancies and multiple C sections. Hx fetal death, Placenta rupture, "clots" in placenta.   RN advised pt to establish prenatal care ASAP as she would be considered high risk and need closer monitoring. Pt in agreement and plans to call [facility] today.   Onalee Hua, FNP informed of patient situation.  The patient was dispensed prenatal vitamins #100 today. I provided counseling today regarding the medication. We discussed the medication, the side effects and when to call clinic. Patient given the opportunity to ask questions. Questions answered.   Sent to [employer] for medicaid/preg Beatriz Chancellor, RN

## 2022-07-16 NOTE — Progress Notes (Signed)
Consulted by RN re: patient situation.  Reviewed RN note and agree that it reflects our discussion and my recommendations. Abdulmalik Darco, FNP
